# Patient Record
Sex: Female | Born: 1980 | Race: Asian | Hispanic: No | Marital: Married | Smoking: Never smoker
Health system: Southern US, Community
[De-identification: ages and names within clinical notes are randomized; demographics above are authoritative.]

## PROBLEM LIST (undated history)

## (undated) ENCOUNTER — Inpatient Hospital Stay (HOSPITAL_COMMUNITY): Payer: Self-pay

## (undated) DIAGNOSIS — K644 Residual hemorrhoidal skin tags: Secondary | ICD-10-CM

## (undated) DIAGNOSIS — K30 Functional dyspepsia: Secondary | ICD-10-CM

## (undated) DIAGNOSIS — N6452 Nipple discharge: Secondary | ICD-10-CM

## (undated) DIAGNOSIS — R05 Cough: Secondary | ICD-10-CM

## (undated) DIAGNOSIS — E785 Hyperlipidemia, unspecified: Secondary | ICD-10-CM

## (undated) HISTORY — DX: Hyperlipidemia, unspecified: E78.5

---

## 2003-06-30 ENCOUNTER — Inpatient Hospital Stay (HOSPITAL_COMMUNITY): Admission: AD | Admit: 2003-06-30 | Discharge: 2003-06-30 | Payer: Self-pay | Admitting: *Deleted

## 2003-07-02 ENCOUNTER — Inpatient Hospital Stay (HOSPITAL_COMMUNITY): Admission: AD | Admit: 2003-07-02 | Discharge: 2003-07-02 | Payer: Self-pay | Admitting: Obstetrics & Gynecology

## 2003-07-09 ENCOUNTER — Inpatient Hospital Stay (HOSPITAL_COMMUNITY): Admission: AD | Admit: 2003-07-09 | Discharge: 2003-07-09 | Payer: Self-pay | Admitting: Obstetrics & Gynecology

## 2003-07-16 ENCOUNTER — Inpatient Hospital Stay (HOSPITAL_COMMUNITY): Admission: AD | Admit: 2003-07-16 | Discharge: 2003-07-16 | Payer: Self-pay | Admitting: Obstetrics & Gynecology

## 2004-06-08 ENCOUNTER — Observation Stay (HOSPITAL_COMMUNITY): Admission: AD | Admit: 2004-06-08 | Discharge: 2004-06-08 | Payer: Self-pay | Admitting: Obstetrics & Gynecology

## 2004-06-09 ENCOUNTER — Encounter (INDEPENDENT_AMBULATORY_CARE_PROVIDER_SITE_OTHER): Payer: Self-pay | Admitting: *Deleted

## 2004-06-09 ENCOUNTER — Inpatient Hospital Stay (HOSPITAL_COMMUNITY): Admission: AD | Admit: 2004-06-09 | Discharge: 2004-06-11 | Payer: Self-pay | Admitting: Obstetrics & Gynecology

## 2005-02-14 ENCOUNTER — Inpatient Hospital Stay (HOSPITAL_COMMUNITY): Admission: AD | Admit: 2005-02-14 | Discharge: 2005-02-14 | Payer: Self-pay | Admitting: *Deleted

## 2005-02-14 ENCOUNTER — Encounter (INDEPENDENT_AMBULATORY_CARE_PROVIDER_SITE_OTHER): Payer: Self-pay | Admitting: *Deleted

## 2007-03-14 ENCOUNTER — Inpatient Hospital Stay (HOSPITAL_COMMUNITY): Admission: AD | Admit: 2007-03-14 | Discharge: 2007-03-16 | Payer: Self-pay | Admitting: Obstetrics

## 2007-03-14 ENCOUNTER — Inpatient Hospital Stay (HOSPITAL_COMMUNITY): Admission: AD | Admit: 2007-03-14 | Discharge: 2007-03-14 | Payer: Self-pay | Admitting: Obstetrics & Gynecology

## 2009-10-04 ENCOUNTER — Ambulatory Visit: Payer: Self-pay | Admitting: Oncology

## 2011-02-07 LAB — CBC
HCT: 34.6 — ABNORMAL LOW
HCT: 38.5
Hemoglobin: 12
Hemoglobin: 13.4
MCHC: 34.7
MCHC: 34.9
MCV: 92.1
MCV: 92.1
Platelets: 195
Platelets: 198
RBC: 3.76 — ABNORMAL LOW
RBC: 4.18
RDW: 13.4
RDW: 14
WBC: 12.8 — ABNORMAL HIGH
WBC: 13.2 — ABNORMAL HIGH

## 2011-02-07 LAB — RPR: RPR Ser Ql: NONREACTIVE

## 2011-03-17 ENCOUNTER — Other Ambulatory Visit: Payer: Self-pay | Admitting: Family Medicine

## 2011-03-17 ENCOUNTER — Other Ambulatory Visit (HOSPITAL_COMMUNITY)
Admission: RE | Admit: 2011-03-17 | Discharge: 2011-03-17 | Disposition: A | Discharge status: Home / Self Care | Payer: Self-pay | Source: Ambulatory Visit | Attending: Family Medicine | Admitting: Family Medicine

## 2011-03-17 ENCOUNTER — Ambulatory Visit
Admission: RE | Admit: 2011-03-17 | Discharge: 2011-03-17 | Disposition: A | Discharge status: Home / Self Care | Payer: No Typology Code available for payment source | Source: Ambulatory Visit | Attending: Family Medicine | Admitting: Family Medicine

## 2011-03-17 DIAGNOSIS — R599 Enlarged lymph nodes, unspecified: Secondary | ICD-10-CM

## 2011-03-17 DIAGNOSIS — Z124 Encounter for screening for malignant neoplasm of cervix: Secondary | ICD-10-CM | POA: Insufficient documentation

## 2012-01-30 HISTORY — PX: BAND HEMORRHOIDECTOMY: SHX1213

## 2012-03-04 ENCOUNTER — Ambulatory Visit (INDEPENDENT_AMBULATORY_CARE_PROVIDER_SITE_OTHER): Payer: Self-pay | Admitting: Family Medicine

## 2012-03-04 ENCOUNTER — Encounter: Payer: Self-pay | Admitting: Family Medicine

## 2012-03-04 VITALS — BP 105/66 | HR 60 | Temp 98.3°F | Ht 65.0 in | Wt 128.0 lb

## 2012-03-04 DIAGNOSIS — R590 Localized enlarged lymph nodes: Secondary | ICD-10-CM

## 2012-03-04 DIAGNOSIS — R197 Diarrhea, unspecified: Secondary | ICD-10-CM

## 2012-03-04 DIAGNOSIS — R109 Unspecified abdominal pain: Secondary | ICD-10-CM

## 2012-03-04 DIAGNOSIS — R599 Enlarged lymph nodes, unspecified: Secondary | ICD-10-CM

## 2012-03-04 LAB — COMPREHENSIVE METABOLIC PANEL
AST: 16 U/L (ref 0–37)
Albumin: 4.4 g/dL (ref 3.5–5.2)
BUN: 9 mg/dL (ref 6–23)
CO2: 27 mEq/L (ref 19–32)
Calcium: 9 mg/dL (ref 8.4–10.5)
Chloride: 103 mEq/L (ref 96–112)
Creatinine, Ser: 0.4 mg/dL (ref 0.4–1.2)
GFR: 174.15 mL/min (ref >60.00)
Glucose, Bld: 83 mg/dL (ref 70–99)
Potassium: 4.1 mEq/L (ref 3.5–5.1)

## 2012-03-04 LAB — CBC WITH DIFFERENTIAL/PLATELET
Basophils Relative: 0.5 % (ref 0.0–3.0)
Eosinophils Relative: 1.1 % (ref 0.0–5.0)
Hemoglobin: 12.1 g/dL (ref 12.0–15.0)
Lymphocytes Relative: 38.6 % (ref 12.0–46.0)
Monocytes Relative: 7.8 % (ref 3.0–12.0)
Neutro Abs: 2 10*3/uL (ref 1.4–7.7)
Neutrophils Relative %: 52 % (ref 43.0–77.0)
RBC: 3.95 Mil/uL (ref 3.87–5.11)
WBC: 3.9 10*3/uL — ABNORMAL LOW (ref 4.5–10.5)

## 2012-03-04 LAB — H. PYLORI ANTIBODY, IGG: H Pylori IgG: NEGATIVE

## 2012-03-04 NOTE — Progress Notes (Signed)
Office Note 03/04/2012  CC:  Chief Complaint  Patient presents with  . Establish Care    lymph nodes or knot on right side of neck    HPI:  Anna Chen is a 31 y.o. Asian female who is here with interpreter, Connye Burkitt, to establish care. Patient's most recent primary MD: Dr Salli Real, whom she saw only once and was evaluated for her hemorrhoid complaint and referred for treatment/ligation of this. Old records were not reviewed prior to or during today's visit.  Onset 2010 of several swollen areas on left side of neck, painless, saw MD at that time and was told they were nothing to worry about (some tests were done?).  There are four of them, and the largest one has stayed the same size but the other 3 that were smaller have grown over the last 3 yrs.  Reviewed her abd sx's, the history of which is a bit confusing due to language barrier, despite the interpreter's help: diarrhea--onset 3 mo ago: sounds like 2 episodes of diarrheal illness, each lasting 3 d and resonding to her med from [place] called Seirogan.  However, most stools since that initial episode have been poorly formed and she has anywhere from 1-5 of these per day.  Unclear whether these sx's are related to food intake or not, but she seems to describe them as being unconnected to her upper abd pains.  Otherwise she describes food induced gastritis/gerd sx's intermittently.  She has adjusted her PO intake to avoid these sx's now for the most part.  No meds have been tried for this.  No postprandial nausea/vomiting or RUQ pain.  Other: last mammogram 01/30/12 and last Pap smear 01/30/12 per pt report today, no record available today. Denies hx of abnormal pap.  LMP 02/17/12.  Past Medical History  Diagnosis Date  . Hyperlipidemia   . IBS (irritable bowel syndrome)     diarrhea predominant    Past Surgical History  Procedure Date  . Band hemorrhoidectomy 01/2012    Family History  Problem Relation Age of Onset  .  Hypertension Mother   . Hypertension Father     History   Social History  . Marital Status: Single    Spouse Name: N/A    Number of Children: N/A  . Years of Education: N/A   Occupational History  . Not on file.   Social History Main Topics  . Smoking status: Never Smoker   . Smokeless tobacco: Never Used  . Alcohol Use: No  . Drug Use: No  . Sexually Active: Not on file   Other Topics Concern  . 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    Outpatient Encounter Prescriptions as of 03/04/2012  Medication Sig Dispense Refill  . OVER THE COUNTER MEDICATION Stomach/anti-diarrheal med from [place].  Takes as needed.        No Known Allergies  ROS Review of Systems  Constitutional: Negative for fever and fatigue.  HENT: Negative for congestion and sore throat.   Eyes: Negative for visual disturbance.  Respiratory: Negative for cough.   Cardiovascular: Negative for chest pain.  Gastrointestinal: Negative for nausea and abdominal pain.  Genitourinary: Negative for dysuria.  Musculoskeletal: Negative for back pain and joint swelling.  Skin: Negative for rash.  Neurological: Negative for weakness and headaches.  Hematological: Negative for adenopathy.    PE; Blood  pressure 105/66, pulse 60, temperature 98.3 F (36.8 C), temperature source Temporal, height 5\' 5"  (1.651 m), weight 128 lb (58.06 kg), last menstrual period 02/17/2012, SpO2 100.00%. Gen: Alert, well appearing.  Patient is oriented to person, place, time, and situation. ENT: Ears: EACs clear, normal epithelium.  TMs with good light reflex and landmarks bilaterally.  Eyes: no injection, icteris, swelling, or exudate.  EOMI, PERRLA. Nose: no drainage or turbinate edema/swelling.  No injection or focal lesion.  Mouth: lips without lesion/swelling.  Oral mucosa pink and moist.  Dentition  intact and without obvious caries or gingival swelling.  Oropharynx without erythema, exudate, or swelling. She has a few comedonal acne lesions on forehead.  Scalp: no rash or lesions. Neck - No masses or thyromegaly or limitation in range of motion.  Lateral portion of left side of neck: I can feel a few small rubbery, moveable, nontender LN's--all 1cm in size or less.  No induration, no rash, no erythema. CV: RRR, no m/r/g.   LUNGS: CTA bilat, nonlabored resps, good aeration in all lung fields. ABD: soft, NT, ND, BS normal.  No hepatospenomegaly or mass.  No bruits. EXT: no clubbing, cyanosis, or edema.   Pertinent labs:  None today  ASSESSMENT AND PLAN:   New pt: no old records to obtain.  Reactive cervical lymphadenopathy Reassured pt that this looked like a benign, reactive process.  Nothing further needs to be done at this time.  Recurrent abdominal pain Confusing, could be multifactorial but I think the biggest part of this is dyspepsia and it is only occurring intermittently. I recommended a trial of prn zantac. Will also check CBC, CMET, and H. Pylori ab  Diarrhea Unclear etiology.  Question of post-infectious IBS. Will obtain stool studies and a celiac panel.    An After Visit Summary was printed and given to the patient.  She declined a flu vaccine today.  Return in about 1 month (around 04/03/2012) for f/u IBS-like sx's, dyspepsia, etc.

## 2012-03-04 NOTE — Assessment & Plan Note (Signed)
Unclear etiology.  Question of post-infectious IBS. Will obtain stool studies and a celiac panel.

## 2012-03-04 NOTE — Patient Instructions (Signed)
Try over the counter med called Zantac, take as directed--use as needed for stomach pains. Start over the counter fiber supplement like metamucil--as directed on the container.

## 2012-03-04 NOTE — Assessment & Plan Note (Signed)
Reassured pt that this looked like a benign, reactive process.  Nothing further needs to be done at this time.

## 2012-03-04 NOTE — Assessment & Plan Note (Signed)
Confusing, could be multifactorial but I think the biggest part of this is dyspepsia and it is only occurring intermittently. I recommended a trial of prn zantac. Will also check CBC, CMET, and H. Pylori ab

## 2012-03-05 LAB — GLIA (IGA/G) + TTG IGA
Gliadin IgG: 61.3 U/mL — ABNORMAL HIGH (ref <20)
Tissue Transglutaminase Ab, IgA: 7.1 U/mL (ref <20)

## 2012-03-06 LAB — CLOSTRIDIUM DIFFICILE BY PCR: Toxigenic C. Difficile by PCR: NOT DETECTED

## 2012-03-07 ENCOUNTER — Encounter: Payer: Self-pay | Admitting: Family Medicine

## 2012-03-07 ENCOUNTER — Ambulatory Visit (INDEPENDENT_AMBULATORY_CARE_PROVIDER_SITE_OTHER): Payer: Self-pay | Admitting: Family Medicine

## 2012-03-07 VITALS — BP 114/71 | HR 76 | Temp 98.4°F | Ht 65.0 in | Wt 128.0 lb

## 2012-03-07 DIAGNOSIS — K648 Other hemorrhoids: Secondary | ICD-10-CM

## 2012-03-07 MED ORDER — HYDROCORTISONE 2.5 % RE CREA: TOPICAL_CREAM | RECTAL | Status: DC

## 2012-03-07 NOTE — Progress Notes (Signed)
OFFICE NOTE  03/12/2012  CC:  Chief Complaint  Patient presents with  . Hemorrhoids    not bleeding; painful/inflamed; worse after BM     HPI: Patient is a 31 y.o. Asian female who is here with her interpreter, Connye Burkitt, for hemorrhoids. Describes hemorrhoids for 5 yrs, recently feels inflammation/pain again in anal area, some slight BRBPR that was short-lived after a BM.   Brings picture in today showing a large purple protrusion exiting anal opening--this pic was taken shortly after a BM.  She says this then retracts back into rectum.   She has been having sx's suggestive of diarrhea-predominant IBS, no constipation.  Pertinent PMH:  Past Medical History  Diagnosis Date  . Hyperlipidemia   . IBS (irritable bowel syndrome)     diarrhea predominant   Past Surgical History  Procedure Date  . Band hemorrhoidectomy 01/2012    MEDS:  Outpatient Prescriptions Prior to Visit  Medication Sig Dispense Refill  . OVER THE COUNTER MEDICATION Stomach/anti-diarrheal med from [place].  Takes as needed.        PE: Blood pressure 114/71, pulse 76, temperature 98.4 F (36.9 C), temperature source Temporal, height 5\' 5"  (1.651 m), weight 128 lb (58.06 kg), last menstrual period 02/17/2012. Pt examined with interpreter Connye Burkitt (female) in the room today. Gen: Alert, well appearing.  Patient is oriented to person, place, time, and situation. Anal exam: no fissures.  One skin tag about 1-2 cm in size, flesh colored and nontender. A few small, noninflamed external hemorrhoids can be seen but these are not tender to palpation. DRE: a sessile mass is palpable at the 6 oclock position internally--it feels about 2-3 cm in size, oblong shaped.  IMPRESSION AND PLAN:  Internal hemorrhoid, bleeding No active bleeding. Will refer to general surgery for further evaluation and management. For mild external anal irritation and barely visible external hemorrhoids will rx Anusol HC 2.5% today.   An After  Visit Summary was printed and given to the patient.  FOLLOW UP: prn

## 2012-03-09 LAB — STOOL CULTURE

## 2012-03-11 ENCOUNTER — Encounter: Payer: Self-pay | Admitting: *Deleted

## 2012-03-12 DIAGNOSIS — K648 Other hemorrhoids: Secondary | ICD-10-CM | POA: Insufficient documentation

## 2012-03-12 NOTE — Assessment & Plan Note (Signed)
No active bleeding. Will refer to general surgery for further evaluation and management. For mild external anal irritation and barely visible external hemorrhoids will rx Anusol HC 2.5% today.

## 2012-03-19 ENCOUNTER — Encounter (INDEPENDENT_AMBULATORY_CARE_PROVIDER_SITE_OTHER): Payer: Self-pay | Admitting: Surgery

## 2012-03-19 ENCOUNTER — Ambulatory Visit (INDEPENDENT_AMBULATORY_CARE_PROVIDER_SITE_OTHER): Payer: Self-pay | Admitting: Surgery

## 2012-03-19 VITALS — BP 104/70 | HR 89 | Temp 97.5°F | Ht 63.0 in | Wt 129.6 lb

## 2012-03-19 DIAGNOSIS — K645 Perianal venous thrombosis: Secondary | ICD-10-CM

## 2012-03-19 NOTE — Progress Notes (Signed)
Patient ID: Anna Chen, female   DOB: 02-26-1981, 31 y.o.   MRN: [id number]  Chief Complaint  Patient presents with  . Pre-op Exam    eval int hems  . Rectal Problems    HPI Anna Chen is a 31 y.o. female.   HPI She is referred by Dr. Marvel Plan for evaluation of a symptomatic external hemorrhoids. She asked we had a single column banded in [facility] September, but wanted to come see me through her interpreter. She reports continued perianal discomfort and 2 large hemorrhoidal columns that occasionally protrude. She has minimal bleeding. Most of her complaint is pain Past Medical History  Diagnosis Date  . Hyperlipidemia   . IBS (irritable bowel syndrome)     diarrhea predominant    Past Surgical History  Procedure Date  . Band hemorrhoidectomy 01/2012    Family History  Problem Relation Age of Onset  . Hypertension Mother   . Hypertension Father   . Thyroid disease Father     Social History History  Substance Use Topics  . Smoking status: Never Smoker   . Smokeless tobacco: Never Used  . Alcohol Use: No    No Known Allergies  No current outpatient prescriptions on file.    Review of Systems Review of Systems  Constitutional: Negative for fever, chills and unexpected weight change.  HENT: Negative for hearing loss, congestion, sore throat, trouble swallowing and voice change.   Eyes: Negative for visual disturbance.  Respiratory: Negative for cough and wheezing.   Cardiovascular: Negative for chest pain, palpitations and leg swelling.  Gastrointestinal: Positive for diarrhea, anal bleeding and rectal pain. Negative for nausea, vomiting, abdominal pain, constipation, blood in stool and abdominal distention.  Genitourinary: Negative for hematuria, vaginal bleeding and difficulty urinating.  Musculoskeletal: Negative for arthralgias.  Skin: Negative for rash and wound.  Neurological: Negative for seizures, syncope and headaches.  Hematological: Negative for  adenopathy. Does not bruise/bleed easily.  Psychiatric/Behavioral: Negative for confusion.    Blood pressure 104/70, pulse 89, temperature 97.5 F (36.4 C), temperature source Temporal, height 5\' 3"  (1.6 m), weight 129 lb 9.6 oz (58.786 kg), last menstrual period 02/17/2012, SpO2 98.00%.  Physical Exam Physical Exam  Constitutional: She is oriented to person, place, and time. She appears well-developed and well-nourished. No distress.  HENT:  Head: Normocephalic and atraumatic.  Right Ear: External ear normal.  Left Ear: External ear normal.  Nose: Nose normal.  Mouth/Throat: Oropharynx is clear and moist.  Eyes: Conjunctivae normal are normal. Pupils are equal, round, and reactive to light. Right eye exhibits no discharge. Left eye exhibits no discharge. No scleral icterus.  Neck: Normal range of motion. Neck supple. No tracheal deviation present. No thyromegaly present.  Cardiovascular: Normal rate, regular rhythm, normal heart sounds and intact distal pulses.   No murmur heard. Pulmonary/Chest: Effort normal and breath sounds normal. No respiratory distress. She has no wheezes.  Abdominal: Soft. Bowel sounds are normal. She exhibits no distension. There is no tenderness.  Genitourinary:       She has 2 protruding hemorrhoids which are edematous. There are external hemorrhoids. 1 appears thrombosed. I performed an incision and drainage and then reduced the hemorrhoids  Musculoskeletal: Normal range of motion. She exhibits no edema and no tenderness.  Neurological: She is alert and oriented to person, place, and time.  Skin: Skin is warm and dry. No rash noted. She is not diaphoretic. No erythema.  Psychiatric: Her behavior is normal. Judgment normal.    Data Reviewed  Assessment    2 column symptomatic external hemorrhoids    Plan    She is requesting hemorrhoidectomy which I feel is reasonable given her symptoms and the size of the hemorrhoids. Through the interpreter I  explained this in detail. Hemorrhoidectomy will thus be scheduled. I also explained the risks in detail the interpreter. Likelihood of success is good       Melysa Schroyer A 03/19/2012, 10:56 AM

## 2012-03-31 DIAGNOSIS — K644 Residual hemorrhoidal skin tags: Secondary | ICD-10-CM

## 2012-03-31 HISTORY — DX: Residual hemorrhoidal skin tags: K64.4

## 2012-04-02 ENCOUNTER — Ambulatory Visit: Payer: Self-pay | Admitting: Family Medicine

## 2012-04-11 ENCOUNTER — Encounter (HOSPITAL_BASED_OUTPATIENT_CLINIC_OR_DEPARTMENT_OTHER): Payer: Self-pay | Admitting: *Deleted

## 2012-04-11 DIAGNOSIS — R059 Cough, unspecified: Secondary | ICD-10-CM

## 2012-04-11 HISTORY — DX: Cough, unspecified: R05.9

## 2012-04-16 NOTE — H&P (Signed)
Patient ID: Anna Chen, female DOB: [birth year], 31 y.o. MRN: [id number]  Chief Complaint   Patient presents with   .  Pre-op Exam     eval int hems   .  Rectal Problems    HPI  Anna Chen is a 31 y.o. female.  HPI  She is referred by Dr. Marvel Plan for evaluation of a symptomatic external hemorrhoids. She asked we had a single column banded in [facility] September, but wanted to come see me through her interpreter. She reports continued perianal discomfort and 2 large hemorrhoidal columns that occasionally protrude. She has minimal bleeding. Most of her complaint is pain  Past Medical History   Diagnosis  Date   .  Hyperlipidemia    .  IBS (irritable bowel syndrome)      diarrhea predominant    Past Surgical History   Procedure  Date   .  Band hemorrhoidectomy  01/2012    Family History   Problem  Relation  Age of Onset   .  Hypertension  Mother    .  Hypertension  Father    .  Thyroid disease  Father     Social History  History   Substance Use Topics   .  Smoking status:  Never Smoker   .  Smokeless tobacco:  Never Used   .  Alcohol Use:  No    No Known Allergies  No current outpatient prescriptions on file.    Review of Systems  Review of Systems  Constitutional: Negative for fever, chills and unexpected weight change.  HENT: Negative for hearing loss, congestion, sore throat, trouble swallowing and voice change.  Eyes: Negative for visual disturbance.  Respiratory: Negative for cough and wheezing.  Cardiovascular: Negative for chest pain, palpitations and leg swelling.  Gastrointestinal: Positive for diarrhea, anal bleeding and rectal pain. Negative for nausea, vomiting, abdominal pain, constipation, blood in stool and abdominal distention.  Genitourinary: Negative for hematuria, vaginal bleeding and difficulty urinating.  Musculoskeletal: Negative for arthralgias.  Skin: Negative for rash and wound.  Neurological: Negative for seizures, syncope and headaches.   Hematological: Negative for adenopathy. Does not bruise/bleed easily.  Psychiatric/Behavioral: Negative for confusion.   Blood pressure 104/70, pulse 89, temperature 97.5 F (36.4 C), temperature source Temporal, height 5\' 3"  (1.6 m), weight 129 lb 9.6 oz (58.786 kg), last menstrual period 02/17/2012, SpO2 98.00%.  Physical Exam  Physical Exam  Constitutional: She is oriented to person, place, and time. She appears well-developed and well-nourished. No distress.  HENT:  Head: Normocephalic and atraumatic.  Right Ear: External ear normal.  Left Ear: External ear normal.  Nose: Nose normal.  Mouth/Throat: Oropharynx is clear and moist.  Eyes: Conjunctivae normal are normal. Pupils are equal, round, and reactive to light. Right eye exhibits no discharge. Left eye exhibits no discharge. No scleral icterus.  Neck: Normal range of motion. Neck supple. No tracheal deviation present. No thyromegaly present.  Cardiovascular: Normal rate, regular rhythm, normal heart sounds and intact distal pulses.  No murmur heard.  Pulmonary/Chest: Effort normal and breath sounds normal. No respiratory distress. She has no wheezes.  Abdominal: Soft. Bowel sounds are normal. She exhibits no distension. There is no tenderness.  Genitourinary:  She has 2 protruding hemorrhoids which are edematous. There are external hemorrhoids. 1 appears thrombosed. I performed an incision and drainage and then reduced the hemorrhoids  Musculoskeletal: Normal range of motion. She exhibits no edema and no tenderness.  Neurological: She is alert and oriented to person,  place, and time.  Skin: Skin is warm and dry. No rash noted. She is not diaphoretic. No erythema.  Psychiatric: Her behavior is normal. Judgment normal.   Data Reviewed  Assessment   2 column symptomatic external hemorrhoids   Plan   She is requesting hemorrhoidectomy which I feel is reasonable given her symptoms and the size of the hemorrhoids. Through the  interpreter I explained this in detail. Hemorrhoidectomy will thus be scheduled. I also explained the risks in detail the interpreter. Likelihood of success is good   Tajah Schreiner A

## 2012-04-17 ENCOUNTER — Encounter (HOSPITAL_BASED_OUTPATIENT_CLINIC_OR_DEPARTMENT_OTHER): Payer: Self-pay | Admitting: *Deleted

## 2012-04-17 ENCOUNTER — Ambulatory Visit (HOSPITAL_BASED_OUTPATIENT_CLINIC_OR_DEPARTMENT_OTHER)
Admission: RE | Admit: 2012-04-17 | Discharge: 2012-04-17 | Disposition: A | Discharge status: Home / Self Care | Payer: Self-pay | Source: Ambulatory Visit | Attending: Surgery | Admitting: Surgery

## 2012-04-17 ENCOUNTER — Encounter (HOSPITAL_BASED_OUTPATIENT_CLINIC_OR_DEPARTMENT_OTHER): Payer: Self-pay | Admitting: Certified Registered"

## 2012-04-17 ENCOUNTER — Encounter (HOSPITAL_BASED_OUTPATIENT_CLINIC_OR_DEPARTMENT_OTHER)
Admission: RE | Disposition: A | Discharge status: Home / Self Care | Payer: Self-pay | Source: Ambulatory Visit | Attending: Surgery

## 2012-04-17 ENCOUNTER — Ambulatory Visit (HOSPITAL_BASED_OUTPATIENT_CLINIC_OR_DEPARTMENT_OTHER): Payer: Self-pay | Admitting: Certified Registered"

## 2012-04-17 DIAGNOSIS — Z8349 Family history of other endocrine, nutritional and metabolic diseases: Secondary | ICD-10-CM | POA: Insufficient documentation

## 2012-04-17 DIAGNOSIS — K648 Other hemorrhoids: Secondary | ICD-10-CM

## 2012-04-17 DIAGNOSIS — Z8249 Family history of ischemic heart disease and other diseases of the circulatory system: Secondary | ICD-10-CM | POA: Insufficient documentation

## 2012-04-17 DIAGNOSIS — K644 Residual hemorrhoidal skin tags: Secondary | ICD-10-CM | POA: Insufficient documentation

## 2012-04-17 DIAGNOSIS — E785 Hyperlipidemia, unspecified: Secondary | ICD-10-CM | POA: Insufficient documentation

## 2012-04-17 DIAGNOSIS — K589 Irritable bowel syndrome without diarrhea: Secondary | ICD-10-CM | POA: Insufficient documentation

## 2012-04-17 HISTORY — DX: Functional dyspepsia: K30

## 2012-04-17 HISTORY — PX: HEMORRHOID SURGERY: SHX153

## 2012-04-17 HISTORY — DX: Cough: R05

## 2012-04-17 HISTORY — DX: Residual hemorrhoidal skin tags: K64.4

## 2012-04-17 LAB — POCT HEMOGLOBIN-HEMACUE: Hemoglobin: 13.1 g/dL (ref 12.0–15.0)

## 2012-04-17 SURGERY — HEMORRHOIDECTOMY
Anesthesia: General | Site: Rectum | Wound class: Clean

## 2012-04-17 MED ORDER — LIDOCAINE HCL (CARDIAC) 20 MG/ML IV SOLN
INTRAVENOUS | Status: DC | PRN
  Administered 2012-04-17: 80 mg via INTRAVENOUS

## 2012-04-17 MED ORDER — SODIUM CHLORIDE 0.9 % IJ SOLN: 3.0000 mL | Freq: Two times a day (BID) | INTRAMUSCULAR | Status: DC

## 2012-04-17 MED ORDER — OXYCODONE HCL 5 MG PO TABS: 5.0000 mg | ORAL_TABLET | Freq: Once | ORAL | Status: DC | PRN

## 2012-04-17 MED ORDER — BUPIVACAINE LIPOSOME 1.3 % IJ SUSP
INTRAMUSCULAR | Status: DC | PRN
  Administered 2012-04-17: 20 mL

## 2012-04-17 MED ORDER — MIDAZOLAM HCL 5 MG/5ML IJ SOLN
INTRAMUSCULAR | Status: DC | PRN
  Administered 2012-04-17: 2 mg via INTRAVENOUS

## 2012-04-17 MED ORDER — OXYCODONE HCL 5 MG PO TABS: 5.0000 mg | ORAL_TABLET | ORAL | Status: DC | PRN

## 2012-04-17 MED ORDER — ONDANSETRON HCL 4 MG/2ML IJ SOLN: 4.0000 mg | Freq: Four times a day (QID) | INTRAMUSCULAR | Status: DC | PRN

## 2012-04-17 MED ORDER — ONDANSETRON HCL 4 MG/2ML IJ SOLN: 4.0000 mg | Freq: Once | INTRAMUSCULAR | Status: DC | PRN

## 2012-04-17 MED ORDER — SODIUM CHLORIDE 0.9 % IV SOLN: 250.0000 mL | INTRAVENOUS | Status: DC | PRN

## 2012-04-17 MED ORDER — MORPHINE SULFATE 4 MG/ML IJ SOLN: 4.0000 mg | INTRAMUSCULAR | Status: DC | PRN

## 2012-04-17 MED ORDER — SODIUM CHLORIDE 0.9 % IJ SOLN: 3.0000 mL | INTRAMUSCULAR | Status: DC | PRN

## 2012-04-17 MED ORDER — HYDROCODONE-ACETAMINOPHEN 5-325 MG PO TABS: 1.0000 | ORAL_TABLET | ORAL | Status: AC | PRN

## 2012-04-17 MED ORDER — HYDROMORPHONE HCL PF 1 MG/ML IJ SOLN: 0.2500 mg | INTRAMUSCULAR | Status: DC | PRN

## 2012-04-17 MED ORDER — LACTATED RINGERS IV SOLN
INTRAVENOUS | Status: DC
  Administered 2012-04-17: 10:00:00 via INTRAVENOUS

## 2012-04-17 MED ORDER — ONDANSETRON HCL 4 MG/2ML IJ SOLN
INTRAMUSCULAR | Status: DC | PRN
  Administered 2012-04-17: 4 mg via INTRAVENOUS

## 2012-04-17 MED ORDER — CEFOXITIN SODIUM 2 G IV SOLR
2.0000 g | INTRAVENOUS | Status: AC
  Administered 2012-04-17: 2 g via INTRAVENOUS

## 2012-04-17 MED ORDER — PROPOFOL 10 MG/ML IV BOLUS
INTRAVENOUS | Status: DC | PRN
  Administered 2012-04-17: 160 mg via INTRAVENOUS

## 2012-04-17 MED ORDER — OXYCODONE HCL 5 MG/5ML PO SOLN
5.0000 mg | Freq: Once | ORAL | Status: DC | PRN
Start: 2012-04-17 — End: 2012-04-17

## 2012-04-17 MED ORDER — DEXAMETHASONE SODIUM PHOSPHATE 4 MG/ML IJ SOLN
INTRAMUSCULAR | Status: DC | PRN
  Administered 2012-04-17: 8 mg via INTRAVENOUS

## 2012-04-17 MED ORDER — KETOROLAC TROMETHAMINE 30 MG/ML IJ SOLN
INTRAMUSCULAR | Status: DC | PRN
  Administered 2012-04-17: 30 mg via INTRAVENOUS

## 2012-04-17 MED ORDER — OXYCODONE HCL 5 MG/5ML PO SOLN: 5.0000 mg | Freq: Once | ORAL | Status: DC | PRN

## 2012-04-17 MED ORDER — FENTANYL CITRATE 0.05 MG/ML IJ SOLN
INTRAMUSCULAR | Status: DC | PRN
  Administered 2012-04-17: 100 ug via INTRAVENOUS

## 2012-04-17 MED ORDER — ACETAMINOPHEN 325 MG PO TABS: 650.0000 mg | ORAL_TABLET | ORAL | Status: DC | PRN

## 2012-04-17 MED ORDER — LIDOCAINE 5 % EX OINT: TOPICAL_OINTMENT | CUTANEOUS | Status: DC | PRN

## 2012-04-17 MED ORDER — ACETAMINOPHEN 650 MG RE SUPP: 650.0000 mg | RECTAL | Status: DC | PRN

## 2012-04-17 SURGICAL SUPPLY — 35 items
BLADE SURG 15 STRL LF DISP TIS (BLADE) ×1 IMPLANT
BLADE SURG 15 STRL SS (BLADE) ×1
CANISTER SUCTION 1200CC (MISCELLANEOUS) ×2 IMPLANT
CLOTH BEACON ORANGE TIMEOUT ST (SAFETY) ×2 IMPLANT
COVER MAYO STAND STRL (DRAPES) IMPLANT
DECANTER SPIKE VIAL GLASS SM (MISCELLANEOUS) IMPLANT
DRAPE UTILITY XL STRL (DRAPES) ×2 IMPLANT
DRSG PAD ABDOMINAL 8X10 ST (GAUZE/BANDAGES/DRESSINGS) ×2 IMPLANT
ELECT REM PT RETURN 9FT ADLT (ELECTROSURGICAL) ×2
ELECTRODE REM PT RTRN 9FT ADLT (ELECTROSURGICAL) ×1 IMPLANT
GAUZE SPONGE 4X4 12PLY STRL LF (GAUZE/BANDAGES/DRESSINGS) ×2 IMPLANT
GLOVE BIO SURGEON STRL SZ 6.5 (GLOVE) ×4 IMPLANT
GLOVE INDICATOR 7.0 STRL GRN (GLOVE) ×2 IMPLANT
GLOVE SURG SIGNA 7.5 PF LTX (GLOVE) ×2 IMPLANT
GOWN PREVENTION PLUS XLARGE (GOWN DISPOSABLE) ×2 IMPLANT
NEEDLE HYPO 25X1 1.5 SAFETY (NEEDLE) ×2 IMPLANT
PACK BASIN DAY SURGERY FS (CUSTOM PROCEDURE TRAY) ×2 IMPLANT
PACK LITHOTOMY IV (CUSTOM PROCEDURE TRAY) ×2 IMPLANT
PENCIL BUTTON HOLSTER BLD 10FT (ELECTRODE) ×2 IMPLANT
SHEARS HARMONIC 9CM CVD (BLADE) ×2 IMPLANT
SHEET MEDIUM DRAPE 40X70 STRL (DRAPES) ×2 IMPLANT
SPONGE SURGIFOAM ABS GEL 100 (HEMOSTASIS) IMPLANT
SPONGE SURGIFOAM ABS GEL 12-7 (HEMOSTASIS) ×2 IMPLANT
SURGILUBE 2OZ TUBE FLIPTOP (MISCELLANEOUS) ×2 IMPLANT
SUT CHROMIC 2 0 SH (SUTURE) ×2 IMPLANT
SUT CHROMIC 3 0 SH 27 (SUTURE) IMPLANT
SYR CONTROL 10ML LL (SYRINGE) ×2 IMPLANT
TOWEL OR 17X24 6PK STRL BLUE (TOWEL DISPOSABLE) ×4 IMPLANT
TOWEL OR NON WOVEN STRL DISP B (DISPOSABLE) ×2 IMPLANT
TRAY DSU PREP LF (CUSTOM PROCEDURE TRAY) ×2 IMPLANT
TRAY PROCTOSCOPIC FIBER OPTIC (SET/KITS/TRAYS/PACK) IMPLANT
TUBE CONNECTING 20X1/4 (TUBING) ×2 IMPLANT
UNDERPAD 30X30 INCONTINENT (UNDERPADS AND DIAPERS) ×2 IMPLANT
WATER STERILE IRR 1000ML POUR (IV SOLUTION) IMPLANT
YANKAUER SUCT BULB TIP NO VENT (SUCTIONS) ×2 IMPLANT

## 2012-04-17 NOTE — Anesthesia Preprocedure Evaluation (Signed)
Anesthesia Evaluation  Patient identified by MRN, date of birth, ID band Patient awake    Reviewed: Allergy & Precautions, H&P , NPO status , Patient's Chart, lab work & pertinent test results  Airway Mallampati: I TM Distance: >3 FB   Mouth opening: Limited Mouth Opening  Dental  (+) Teeth Intact and Dental Advisory Given   Pulmonary  breath sounds clear to auscultation        Cardiovascular Rhythm:Regular Rate:Normal     Neuro/Psych    GI/Hepatic   Endo/Other    Renal/GU      Musculoskeletal   Abdominal   Peds  Hematology   Anesthesia Other Findings I could not find a hx of blood dyscrasias or PVD from the pt through her interpreter.  Reproductive/Obstetrics                           Anesthesia Physical Anesthesia Plan  ASA: I  Anesthesia Plan: General   Post-op Pain Management:    Induction: Intravenous  Airway Management Planned: LMA  Additional Equipment:   Intra-op Plan:   Post-operative Plan: Extubation in OR  Informed Consent: I have reviewed the patients History and Physical, chart, labs and discussed the procedure including the risks, benefits and alternatives for the proposed anesthesia with the patient or authorized representative who has indicated his/her understanding and acceptance.   Dental advisory given  Plan Discussed with: CRNA, Anesthesiologist and Surgeon  Anesthesia Plan Comments:         Anesthesia Quick Evaluation

## 2012-04-17 NOTE — Anesthesia Postprocedure Evaluation (Signed)
  Anesthesia Post-op Note  Patient: Anna Chen  Procedure(s) Performed: Procedure(s) (LRB) with comments: HEMORRHOIDECTOMY (N/A) - 2 column external /internal hemorrhoidectomy  Patient Location: PACU  Anesthesia Type:General  Level of Consciousness: awake, alert  and oriented  Airway and Oxygen Therapy: Patient Spontanous Breathing  Post-op Pain: none  Post-op Assessment: Post-op Vital signs reviewed  Post-op Vital Signs: Reviewed  Complications: No apparent anesthesia complications

## 2012-04-17 NOTE — Anesthesia Procedure Notes (Signed)
Procedure Name: LMA Insertion Date/Time: 04/17/2012 10:03 AM Performed by: Verlan Friends Pre-anesthesia Checklist: Patient identified, Emergency Drugs available, Suction available, Patient being monitored and Timeout performed Patient Re-evaluated:Patient Re-evaluated prior to inductionOxygen Delivery Method: Circle System Utilized Preoxygenation: Pre-oxygenation with 100% oxygen Intubation Type: IV induction Ventilation: Mask ventilation without difficulty LMA: LMA inserted LMA Size: 4.0 Number of attempts: 1 Airway Equipment and Method: bite block Placement Confirmation: positive ETCO2 Tube secured with: Tape Dental Injury: Teeth and Oropharynx as per pre-operative assessment

## 2012-04-17 NOTE — Transfer of Care (Signed)
Immediate Anesthesia Transfer of Care Note  Patient: Anna Chen  Procedure(s) Performed: Procedure(s) (LRB) with comments: HEMORRHOIDECTOMY (N/A) - 2 column external /internal hemorrhoidectomy  Patient Location: PACU  Anesthesia Type:General  Level of Consciousness: sedated and unresponsive  Airway & Oxygen Therapy: Patient Spontanous Breathing and Patient connected to face mask oxygen  Post-op Assessment: Report given to PACU RN and Post -op Vital signs reviewed and stable  Post vital signs: Reviewed and stable  Complications: No apparent anesthesia complications

## 2012-04-17 NOTE — Op Note (Signed)
HEMORRHOIDECTOMY  Procedure Note  Anna Chen 04/17/2012   Pre-op Diagnosis: symptomic external hemorrhoids     Post-op Diagnosis: bleeding internal and external hemorrhoids  Procedure(s): 2 COLUMN INTERNAL AND EXTERNAL HEMORRHOIDECTOMY  Surgeon(s): Shelly Rubenstein, MD  Anesthesia: General  Staff:  Drue Dun, CST - Scrub Person Salley Scarlet, RN - Circulator  Estimated Blood Loss: Minimal               Specimens: hemorrhoids  Procedure: The patient was brought to the operating room identified as the correct patient. She was placed supine on the operating room table and general anesthesia was induced. The patient was placed in the lithotomy position. Her perianal area was then prepped and draped in the usual sterile fashion. I injected bupivacaine circumferentially around the perianal area. The patient had Chen large internal and external hemorrhoidal column in directly anteriorly. I excises the harmonic scalpel. It was sent to pathology for evaluation. There was Chen smaller column just to right of the posterior midline. I excises the harmonic scalpel as well. No other intra-anal pathology was identified. I injected further bupivacaine around the perianal area. I inserted Chen small piece of Gelfoam into the anal canal. The patient tolerated the procedure well. All the counts were correct at the end of the procedure. The patient was then extubated in the operating room and taken extant condition to the recovery room.          Anna Chen   Date: 04/17/2012  Time: 10:23 AM

## 2012-04-17 NOTE — Interval H&P Note (Signed)
History and Physical Interval Note: no change in H and P  04/17/2012 9:10 AM  Anna Chen  has presented today for surgery, with the diagnosis of symptomic external hemorrhoids  The various methods of treatment have been discussed with the patient and family. After consideration of risks, benefits and other options for treatment, the patient has consented to  Procedure(s) (LRB) with comments: HEMORRHOIDECTOMY (N/A) - 2 column external /internal hemorrhoidectomy as a surgical intervention .  The patient's history has been reviewed, patient examined, no change in status, stable for surgery.  I have reviewed the patient's chart and labs.  Questions were answered to the patient's satisfaction.     [name]

## 2012-04-18 ENCOUNTER — Encounter (HOSPITAL_BASED_OUTPATIENT_CLINIC_OR_DEPARTMENT_OTHER): Payer: Self-pay | Admitting: Surgery

## 2012-04-28 ENCOUNTER — Telehealth (INDEPENDENT_AMBULATORY_CARE_PROVIDER_SITE_OTHER): Payer: Self-pay | Admitting: General Surgery

## 2012-04-28 NOTE — Telephone Encounter (Signed)
She called saying that she was having some discharge from the area of her hemorrhoidectomy with some bleeding.  Her friend talked for her because of her lack of english.  I recommended that she go to the ER for evaluation because I could not diagnose her condition over the phone.  If she does not go to the ER then she should call our office to be seen tomorrow.

## 2012-04-28 NOTE — Telephone Encounter (Signed)
She called saying that she was having some discharge from the area of her hemorrhoidectomy with some bleeding.  Her friend talked for her because of her lack of english.  I recommended that she go to the ER for evaluation because I could not diagnose her condition over the phone.  If she does not go to the ER then she should call our office to be seen tomorrow. 

## 2012-04-29 ENCOUNTER — Telehealth (INDEPENDENT_AMBULATORY_CARE_PROVIDER_SITE_OTHER): Payer: Self-pay | Admitting: General Surgery

## 2012-04-29 NOTE — Telephone Encounter (Signed)
Patient's interpreter called stating that patient was having mucus and blood mix with odor when wiping rectal area with toilet paper and not wet wipes, patient was not running any fever...she is s/p hemorrhoidectomy on 04/17/12 by Dr.Blackman and has follow up appt on 05/07/12 with Dr.Blackman, patient was not with interpreter so i gave the interpreter d/c instructions that the patient needs to be following and is suppose to call us back should she have any other questions or concerns

## 2012-05-07 ENCOUNTER — Ambulatory Visit (INDEPENDENT_AMBULATORY_CARE_PROVIDER_SITE_OTHER): Payer: Self-pay | Admitting: Surgery

## 2012-05-07 ENCOUNTER — Encounter (INDEPENDENT_AMBULATORY_CARE_PROVIDER_SITE_OTHER): Payer: Self-pay | Admitting: Surgery

## 2012-05-07 VITALS — BP 110/66 | HR 72 | Temp 97.1°F | Resp 12 | Ht 63.0 in | Wt 129.6 lb

## 2012-05-07 DIAGNOSIS — Z09 Encounter for follow-up examination after completed treatment for conditions other than malignant neoplasm: Secondary | ICD-10-CM

## 2012-05-07 NOTE — Progress Notes (Signed)
Subjective:     Patient ID: Anna Chen, female   DOB: May 08, 1980, 32 y.o.   MRN: [id number]  HPI She is here for her first postop visit. She is doing well and has no complaints. She has minimal drainage  Review of Systems     Objective:   Physical Exam On exam, the hemorrhoidectomy sites are healing well. The final pathology showed benign hemorrhoidal tissue    Assessment:     Patient stable postop    Plan:     I will see her as needed. If she is still having drainage a month she will come back and see me

## 2012-10-30 ENCOUNTER — Ambulatory Visit (INDEPENDENT_AMBULATORY_CARE_PROVIDER_SITE_OTHER)
Admission: RE | Admit: 2012-10-30 | Discharge: 2012-10-30 | Disposition: A | Discharge status: Home / Self Care | Payer: Self-pay | Source: Ambulatory Visit | Attending: Family Medicine | Admitting: Family Medicine

## 2012-10-30 ENCOUNTER — Ambulatory Visit (INDEPENDENT_AMBULATORY_CARE_PROVIDER_SITE_OTHER): Payer: Self-pay | Admitting: Family Medicine

## 2012-10-30 ENCOUNTER — Encounter: Payer: Self-pay | Admitting: Family Medicine

## 2012-10-30 ENCOUNTER — Telehealth: Payer: Self-pay | Admitting: Family Medicine

## 2012-10-30 VITALS — BP 104/64 | HR 62 | Temp 97.6°F | Resp 16 | Ht 65.0 in | Wt 131.0 lb

## 2012-10-30 DIAGNOSIS — K5900 Constipation, unspecified: Secondary | ICD-10-CM

## 2012-10-30 MED ORDER — PANTOPRAZOLE SODIUM 40 MG PO TBEC: 40.0000 mg | DELAYED_RELEASE_TABLET | Freq: Every day | ORAL | Status: DC

## 2012-10-30 NOTE — Telephone Encounter (Signed)
Pls notify pt that her x-ray of her bowels did show that she has lots of retained stool and likely would benefit from a DAILY medication to help with constipation. I recommend she buy an OTC med call senakot-S (definitely buy the generic/store brand) and take 2 tabs every night until I see her again.  Take the other med I prescribed today as well (pantoprazole)--no change in the plans as far as this med goes.-thx

## 2012-10-30 NOTE — Patient Instructions (Signed)
Gastroesophageal Reflux Disease, Adult  Gastroesophageal reflux disease (GERD) happens when acid from your stomach flows up into the esophagus. When acid comes in contact with the esophagus, the acid causes soreness (inflammation) in the esophagus. Over time, GERD may create small holes (ulcers) in the lining of the esophagus.  CAUSES   · Increased body weight. This puts pressure on the stomach, making acid rise from the stomach into the esophagus.  · Smoking. This increases acid production in the stomach.  · Drinking alcohol. This causes decreased pressure in the lower esophageal sphincter (valve or ring of muscle between the esophagus and stomach), allowing acid from the stomach into the esophagus.  · Late evening meals and a full stomach. This increases pressure and acid production in the stomach.  · A malformed lower esophageal sphincter.  Sometimes, no cause is found.  SYMPTOMS   · Burning pain in the lower part of the mid-chest behind the breastbone and in the mid-stomach area. This may occur twice a week or more often.  · Trouble swallowing.  · Sore throat.  · Dry cough.  · Asthma-like symptoms including chest tightness, shortness of breath, or wheezing.  DIAGNOSIS   Your caregiver may be able to diagnose GERD based on your symptoms. In some cases, X-rays and other tests may be done to check for complications or to check the condition of your stomach and esophagus.  TREATMENT   Your caregiver may recommend over-the-counter or prescription medicines to help decrease acid production. Ask your caregiver before starting or adding any new medicines.   HOME CARE INSTRUCTIONS   · Change the factors that you can control. Ask your caregiver for guidance concerning weight loss, quitting smoking, and alcohol consumption.  · Avoid foods and drinks that make your symptoms worse, such as:  · Caffeine or alcoholic drinks.  · Chocolate.  · Peppermint or mint flavorings.  · Garlic and onions.  · Spicy foods.  · Citrus fruits,  such as oranges, lemons, or limes.  · Tomato-based foods such as sauce, chili, salsa, and pizza.  · Fried and fatty foods.  · Avoid lying down for the 3 hours prior to your bedtime or prior to taking a nap.  · Eat small, frequent meals instead of large meals.  · Wear loose-fitting clothing. Do not wear anything tight around your waist that causes pressure on your stomach.  · Raise the head of your bed 6 to 8 inches with wood blocks to help you sleep. Extra pillows will not help.  · Only take over-the-counter or prescription medicines for pain, discomfort, or fever as directed by your caregiver.  · Do not take aspirin, ibuprofen, or other nonsteroidal anti-inflammatory drugs (NSAIDs).  SEEK IMMEDIATE MEDICAL CARE IF:   · You have pain in your arms, neck, jaw, teeth, or back.  · Your pain increases or changes in intensity or duration.  · You develop nausea, vomiting, or sweating (diaphoresis).  · You develop shortness of breath, or you faint.  · Your vomit is green, yellow, black, or looks like coffee grounds or blood.  · Your stool is red, bloody, or black.  These symptoms could be signs of other problems, such as heart disease, gastric bleeding, or esophageal bleeding.  MAKE SURE YOU:   · Understand these instructions.  · Will watch your condition.  · Will get help right away if you are not doing well or get worse.  Document Released: 01/25/2005 Document Revised: 07/10/2011 Document Reviewed: 11/04/2010  Exit[employer]® Patient   Information ©2014 [employer].

## 2012-10-30 NOTE — Telephone Encounter (Signed)
Ally aware and will pass information on to patient.

## 2012-10-30 NOTE — Progress Notes (Addendum)
OFFICE NOTE  10/30/2012  CC:  Chief Complaint  Patient presents with  . Rectal Bleeding     HPI: Patient is a 32 y.o. Asian female who is here for stool complaints. Says stool not normal "shape", comes out in pieces and has some undigested vegetable matter, common to feel incomplete evacuation.  One month ago a couple of days of diarrhea.   No blood in stool.  No pus in stool.  After eating she burps a lot.  Has some GER after spicy foods and sweets.  Bloating sx's are fairly common for her.   She was rx'd zantac in the past by myself and this helped but recent use of this again recently was unsuccessful. Points to mid abd just above umbillicus as the common area she feels pain. Has tried nexium for 1 wk and it helped but it caused submandibular discomfort and odd sensation in ears.   She tried samples of linzess x 2 d and it caused diarrhea so she stopped it.  LMP was 10/15/12 Menses are fairly regular.  Pertinent PMH:  Past Medical History  Diagnosis Date  . Hyperlipidemia     no current med.  . Indigestion     takes OTC as needed  . Hemorrhoids, external 03/2012    symptomatic  . Cough 04/11/2012   Past Surgical History  Procedure Laterality Date  . Band hemorrhoidectomy  01/2012  . Hemorrhoid surgery  04/17/2012    Procedure: HEMORRHOIDECTOMY;  Surgeon: Shelly Rubenstein, MD;  Location: [facility];  Service: General;  Laterality: N/A;  2 column external /internal hemorrhoidectomy    MEDS:  Outpatient Prescriptions Prior to Visit  Medication Sig Dispense Refill  . lidocaine (XYLOCAINE) 5 % ointment Apply topically as needed.  35.44 g  0   No facility-administered medications prior to visit.  Probiotic (in an [place] package)  PE: Blood pressure 104/64, pulse 62, temperature 97.6 F (36.4 C), temperature source Oral, resp. rate 16, height 5\' 5"  (1.651 m), weight 131 lb (59.421 kg), SpO2 99.00%. ENT: no icterus Gen: Alert, well appearing.  Patient is  oriented to person, place, time, and situation. CV: RRR, no m/r/g.   LUNGS: CTA bilat, nonlabored resps, good aeration in all lung fields. ABD: soft, NT, ND, BS normal.  No hepatospenomegaly or mass.  No bruits. EXT: no clubbing, cyanosis, or edema.    IMPRESSION AND PLAN:  Dyspepsia and GERD + possible constip predominant IBS. Will start trial of pantoprazole 40mg  qd and obtain KUB today to check stool burden. GERD diet handout given.  FOLLOW UP: 1 mo    ADDENDUM: KUB per my reading does show lots of stool burden in entire colon, without distention. Will recommend pt take 2 generic senakot S nightly and will f/u radiologist's over-read of the KUB today.

## 2012-12-03 ENCOUNTER — Ambulatory Visit (INDEPENDENT_AMBULATORY_CARE_PROVIDER_SITE_OTHER): Payer: Self-pay | Admitting: Family Medicine

## 2012-12-03 ENCOUNTER — Encounter: Payer: Self-pay | Admitting: Family Medicine

## 2012-12-03 VITALS — BP 116/72 | HR 65 | Temp 98.2°F | Resp 16 | Ht 65.0 in | Wt 132.0 lb

## 2012-12-03 DIAGNOSIS — R1013 Epigastric pain: Secondary | ICD-10-CM

## 2012-12-03 DIAGNOSIS — K589 Irritable bowel syndrome without diarrhea: Secondary | ICD-10-CM

## 2012-12-03 MED ORDER — PANTOPRAZOLE SODIUM 40 MG PO TBEC: 40.0000 mg | DELAYED_RELEASE_TABLET | Freq: Every day | ORAL | Status: DC

## 2012-12-03 NOTE — Patient Instructions (Signed)
Buy generic for metamucil and take 1 full dose as directed on container every night. High fiber diet.

## 2012-12-03 NOTE — Assessment & Plan Note (Signed)
I think she has a mild case of this. I will have her continue senakot (without colace added) and I'll have her add metamucil nightly. High fiber diet handout given.

## 2012-12-03 NOTE — Assessment & Plan Note (Signed)
Much improved. Rx for pantoprazole 40mg  qd to fill if sx's recur and stay for more than 1-2 days.

## 2012-12-03 NOTE — Progress Notes (Signed)
OFFICE NOTE  12/03/2012  CC:  Chief Complaint  Patient presents with  . Follow-up     HPI: Patient is a 32 y.o. Asian female who is here for 1 mo f/u constipation and GERD. Started pantoprazole 40mg  qd and senakot without colace 2 tabs qhs last visit.   She has been compliant with these meds and feels better.  Has been off of her pantoprazole for 1 week and her epigastric discomfort and has had no return of symptoms.    Still having intermittent periods of 1-2 looser type BMs per day, some feeling of incomplete emptying.  No postprandial urgent diarrhea.  No lower abd cramping. These sx's have been present for about 1 yr.  No one in home with similar sx's.  Seems to have some normal days and some not normal.  She notes that when she eats more high fiber foods she has a better formed BM.  When eats more fruits and veggies she has a looser BM, less satisfactory colon emptying.   Pertinent PMH:  Past Medical History  Diagnosis Date  . Hyperlipidemia     no current med.  . Indigestion     takes OTC as needed  . Hemorrhoids, external 03/2012    symptomatic  . Cough 04/11/2012   Past Surgical History  Procedure Laterality Date  . Band hemorrhoidectomy  01/2012  . Hemorrhoid surgery  04/17/2012    Procedure: HEMORRHOIDECTOMY;  Surgeon: Shelly Rubenstein, MD;  Location: [facility];  Service: General;  Laterality: N/A;  2 column external /internal hemorrhoidectomy    MEDS:  Outpatient Prescriptions Prior to Visit  Medication Sig Dispense Refill  . pantoprazole (PROTONIX) 40 MG tablet Take 1 tablet (40 mg total) by mouth daily.  30 tablet  3  . lidocaine (XYLOCAINE) 5 % ointment Apply topically as needed.  35.44 g  0   No facility-administered medications prior to visit.  Senakot nightly  PE: Blood pressure 116/72, pulse 65, temperature 98.2 F (36.8 C), temperature source Temporal, resp. rate 16, height 5\' 5"  (1.651 m), weight 132 lb (59.875 kg), last menstrual  period 11/21/2012, SpO2 98.00%. Gen: Alert, well appearing.  Patient is oriented to person, place, time, and situation. ABD: soft, NT, ND, BS normal.  No hepatospenomegaly or mass.  No bruits.  IMPRESSION AND PLAN:  Dyspepsia Much improved. Rx for pantoprazole 40mg  qd to fill if sx's recur and stay for more than 1-2 days.   IBS (irritable bowel syndrome) I think she has a mild case of this. I will have her continue senakot (without colace added) and I'll have her add metamucil nightly. High fiber diet handout given.   An After Visit Summary was printed and given to the patient.  FOLLOW UP: 49mo

## 2013-02-28 ENCOUNTER — Ambulatory Visit
Admission: RE | Admit: 2013-02-28 | Discharge: 2013-02-28 | Disposition: A | Discharge status: Home / Self Care | Payer: No Typology Code available for payment source | Source: Ambulatory Visit | Attending: Chiropractic Medicine | Admitting: Chiropractic Medicine

## 2013-02-28 ENCOUNTER — Other Ambulatory Visit: Payer: Self-pay | Admitting: Chiropractic Medicine

## 2013-02-28 DIAGNOSIS — M5412 Radiculopathy, cervical region: Secondary | ICD-10-CM

## 2013-02-28 DIAGNOSIS — M542 Cervicalgia: Secondary | ICD-10-CM

## 2013-02-28 DIAGNOSIS — M5416 Radiculopathy, lumbar region: Secondary | ICD-10-CM

## 2013-02-28 DIAGNOSIS — M545 Low back pain: Secondary | ICD-10-CM

## 2013-08-14 ENCOUNTER — Encounter: Payer: Self-pay | Admitting: Family Medicine

## 2013-08-14 ENCOUNTER — Ambulatory Visit (INDEPENDENT_AMBULATORY_CARE_PROVIDER_SITE_OTHER): Payer: Self-pay | Admitting: Family Medicine

## 2013-08-14 VITALS — BP 108/69 | HR 66 | Temp 97.9°F | Resp 18 | Ht 65.0 in | Wt 136.0 lb

## 2013-08-14 DIAGNOSIS — R590 Localized enlarged lymph nodes: Secondary | ICD-10-CM

## 2013-08-14 DIAGNOSIS — R599 Enlarged lymph nodes, unspecified: Secondary | ICD-10-CM

## 2013-08-14 DIAGNOSIS — K219 Gastro-esophageal reflux disease without esophagitis: Secondary | ICD-10-CM

## 2013-08-14 DIAGNOSIS — K589 Irritable bowel syndrome without diarrhea: Secondary | ICD-10-CM

## 2013-08-14 DIAGNOSIS — J310 Chronic rhinitis: Secondary | ICD-10-CM

## 2013-08-14 NOTE — Progress Notes (Signed)
Pre visit review using our clinic review tool, if applicable. No additional management support is needed unless otherwise documented below in the visit note. 

## 2013-08-14 NOTE — Progress Notes (Signed)
OFFICE NOTE  08/14/2013  CC:  Chief Complaint  Patient presents with  . Follow-up    GERD, IBS     HPI: Patient is a 33 y.o. Mandarin [place] female who is here with her interpreter, Radonna Ricker, for f/u. I have not seen her in about 8 mo.   Last 2 mo has had nasal irritation and occ blood tinged crusty mucous.  Not much sneezing, no cough.  No itchy/runny eyes, no itchy throat, no stuffy head feeling.  Says she has left sided neck swelling/lump for last 5 yrs.  Was examined by an MD for this 5 yrs ago and was told to "monitor it" and she reports nothing has changed until recently she has felt like one has gotten a bit bigger.  No signif pain in the region.  Denies hx of any imaging being done in this area.  She says everything from a GI standpoint is going well.   Pertinent PMH:  Past medical, surgical, social hx reviewed. Mainly GERD, constipation, IBS. She reports that she got a pap smear in [place] this year already and it was normal. Also, she is still lactating (last child b. 6 years ago) and some blood tests were done in [place] at [facility] and she doesn't recall results and was told "it could be your hormones".  She cannot recall the name of MD or office so we cannot get records.  MEDS: Not taking pantoprazole anymore. Has several vitamin/dietary supplements with her, including a chinese pancreatic enzyme pill.    PE: Blood pressure 108/69, pulse 66, temperature 97.9 F (36.6 C), temperature source Oral, resp. rate 18, height 5\' 5"  (1.651 m), weight 136 lb (61.689 kg), last menstrual period 08/06/2013, SpO2 100.00%. Gen: Alert, well appearing.  Patient is oriented to person, place, time, and situation. ENT: Ears: EACs clear, normal epithelium.  TMs with good light reflex and landmarks bilaterally.  Eyes: no injection, icteris, swelling, or exudate.  EOMI, PERRLA. Nose: no drainage or turbinate edema/swelling.  No injection or focal lesion.  Mouth: lips without lesion/swelling.  Oral mucosa  pink and moist.  One or two pinpoint areas of irritation.  Mildly edematous turbinates.  No purulent mucous.  Dentition intact and without obvious caries or gingival swelling.  Oropharynx without erythema, exudate, or swelling.  NECK: no LAD, mass, thyromegaly, or tenderness. CV: RRR, no m/r/g.   LUNGS: CTA bilat, nonlabored resps, good aeration in all lung fields.   IMPRESSION AND PLAN:  1) Nasal cong/irritation: pretty normal.  Reassured.  Use OTC saline nasal spray regularly. Avoid aeroallergens if possible.  2) GERD/constipation/IBS: all doing well currently OFF of rx meds. May continue taking the chinese supplements she is on (vitamins + one pancreatic enzyme supplement).  An After Visit Summary was printed and given to the patient.  FOLLOW UP: prn

## 2013-08-14 NOTE — Patient Instructions (Signed)
Buy over the counter, generic saline nasal spray and use 2-3 sprays in each nostril 1-2 times per day to help keep nose from getting irritated.

## 2013-11-13 ENCOUNTER — Ambulatory Visit (INDEPENDENT_AMBULATORY_CARE_PROVIDER_SITE_OTHER): Payer: Self-pay | Admitting: Family Medicine

## 2013-11-13 ENCOUNTER — Encounter: Payer: Self-pay | Admitting: Family Medicine

## 2013-11-13 VITALS — BP 100/65 | HR 60 | Temp 99.2°F | Resp 16 | Ht 65.0 in | Wt 132.0 lb

## 2013-11-13 DIAGNOSIS — A499 Bacterial infection, unspecified: Secondary | ICD-10-CM

## 2013-11-13 DIAGNOSIS — B373 Candidiasis of vulva and vagina: Secondary | ICD-10-CM

## 2013-11-13 DIAGNOSIS — N760 Acute vaginitis: Secondary | ICD-10-CM

## 2013-11-13 DIAGNOSIS — B9689 Other specified bacterial agents as the cause of diseases classified elsewhere: Secondary | ICD-10-CM

## 2013-11-13 DIAGNOSIS — B3731 Acute candidiasis of vulva and vagina: Secondary | ICD-10-CM

## 2013-11-13 DIAGNOSIS — N898 Other specified noninflammatory disorders of vagina: Secondary | ICD-10-CM

## 2013-11-13 LAB — POCT WET PREP (WET MOUNT)
Clue Cells Wet Prep Whiff POC: POSITIVE
KOH WET PREP POC: POSITIVE
TRICHOMONAS WET PREP HPF POC: NEGATIVE

## 2013-11-13 MED ORDER — METRONIDAZOLE 500 MG PO TABS: ORAL_TABLET | ORAL | Status: DC

## 2013-11-13 MED ORDER — FLUCONAZOLE 150 MG PO TABS: ORAL_TABLET | ORAL | Status: DC

## 2013-11-13 NOTE — Progress Notes (Signed)
OFFICE NOTE  11/13/2013  CC:  Chief Complaint  Patient presents with  . Vaginal Itching    outside itching   HPI: Patient is a 33 y.o. Asian female who is here with interpreter, Radonna Ricker, for vaginal itching.   Half a year of vag itching-"inside the labia but not deep".  No itching on outside of vagina.  Also has had 1-2 yrs of lots of white d/c.  Seems to be getting worse. Married, monogamous.  No hx of STD. LMP 10/22/13--approx 27 d cycles.  No vag spotting in between menses. No contraception method is used.  Pap is UTD.  Pertinent PMH:  Past surgical, social, and family history reviewed and no changes noted since last office visit.  MEDS:  NONE  PE: Blood pressure 100/65, pulse 60, temperature 99.2 F (37.3 C), temperature source Temporal, resp. rate 16, height 5\' 5"  (1.651 m), weight 132 lb (59.875 kg), last menstrual period 10/22/2013, SpO2 99.00%. Gen: Alert, well appearing.  Patient is oriented to person, place, time, and situation. Vagina and vulva are normal;  A fair amount of white discharge is noted.  Cervix normal without lesions.  GC/chlamydia cervical swab done, wet prep specimen obtained.   Exam chaperoned by female assistant-CMA Jacklynn Ganong.  Wet prep: many clue cells, many wBCs, many hyphae and budding yeast.  No trich.  IMPRESSION AND PLAN:  Bacterial vaginosis and yeast vaginitis. Flagyl 500mg  bid x 7d. Diflucan 150mg  qd x 2d.  An After Visit Summary was printed and given to the patient.  FOLLOW UP: prn

## 2013-11-13 NOTE — Progress Notes (Signed)
Pre visit review using our clinic review tool, if applicable. No additional management support is needed unless otherwise documented below in the visit note. 

## 2013-11-15 LAB — GC/CHLAMYDIA PROBE AMP
CT PROBE, AMP APTIMA: NEGATIVE
GC PROBE AMP APTIMA: NEGATIVE

## 2014-07-18 ENCOUNTER — Ambulatory Visit (INDEPENDENT_AMBULATORY_CARE_PROVIDER_SITE_OTHER): Payer: Self-pay | Admitting: Family Medicine

## 2014-07-18 VITALS — BP 100/60 | HR 62 | Temp 97.6°F | Ht 65.0 in | Wt 137.2 lb

## 2014-07-18 DIAGNOSIS — K644 Residual hemorrhoidal skin tags: Secondary | ICD-10-CM

## 2014-07-18 DIAGNOSIS — K5900 Constipation, unspecified: Secondary | ICD-10-CM

## 2014-07-18 DIAGNOSIS — Z0000 Encounter for general adult medical examination without abnormal findings: Secondary | ICD-10-CM

## 2014-07-18 DIAGNOSIS — K648 Other hemorrhoids: Secondary | ICD-10-CM

## 2014-07-18 DIAGNOSIS — L700 Acne vulgaris: Secondary | ICD-10-CM

## 2014-07-18 DIAGNOSIS — IMO0002 Reserved for concepts with insufficient information to code with codable children: Secondary | ICD-10-CM

## 2014-07-18 DIAGNOSIS — N898 Other specified noninflammatory disorders of vagina: Secondary | ICD-10-CM

## 2014-07-18 DIAGNOSIS — R896 Abnormal cytological findings in specimens from other organs, systems and tissues: Secondary | ICD-10-CM

## 2014-07-18 LAB — COMPREHENSIVE METABOLIC PANEL
ALT: 15 U/L (ref 0–35)
AST: 14 U/L (ref 0–37)
Albumin: 4.2 g/dL (ref 3.5–5.2)
Alkaline Phosphatase: 37 U/L — ABNORMAL LOW (ref 39–117)
BUN: 11 mg/dL (ref 6–23)
CO2: 25 meq/L (ref 19–32)
CREATININE: 0.5 mg/dL (ref 0.50–1.10)
Calcium: 9.1 mg/dL (ref 8.4–10.5)
Chloride: 105 mEq/L (ref 96–112)
Glucose, Bld: 86 mg/dL (ref 70–99)
Potassium: 4.5 mEq/L (ref 3.5–5.3)
SODIUM: 136 meq/L (ref 135–145)
TOTAL PROTEIN: 7.1 g/dL (ref 6.0–8.3)
Total Bilirubin: 0.4 mg/dL (ref 0.2–1.2)

## 2014-07-18 LAB — POCT URINALYSIS DIPSTICK
BILIRUBIN UA: NEGATIVE
Glucose, UA: NEGATIVE
KETONES UA: NEGATIVE
Leukocytes, UA: NEGATIVE
NITRITE UA: NEGATIVE
PROTEIN UA: NEGATIVE
RBC UA: NEGATIVE
Spec Grav, UA: 1.015
Urobilinogen, UA: 0.2
pH, UA: 7

## 2014-07-18 LAB — POCT WET PREP WITH KOH
CLUE CELLS WET PREP PER HPF POC: NEGATIVE
KOH Prep POC: NEGATIVE
Trichomonas, UA: NEGATIVE
YEAST WET PREP PER HPF POC: NEGATIVE

## 2014-07-18 LAB — POCT UA - MICROSCOPIC ONLY
Casts, Ur, LPF, POC: NEGATIVE
Crystals, Ur, HPF, POC: NEGATIVE
Mucus, UA: NEGATIVE
Yeast, UA: NEGATIVE

## 2014-07-18 LAB — POCT SEDIMENTATION RATE: POCT SED RATE: 16 mm/hr (ref 0–22)

## 2014-07-18 LAB — POCT CBC
GRANULOCYTE PERCENT: 57.6 % (ref 37–80)
HCT, POC: 37.5 % — AB (ref 37.7–47.9)
Hemoglobin: 12 g/dL — AB (ref 12.2–16.2)
LYMPH, POC: 1.9 (ref 0.6–3.4)
MCH, POC: 29.3 pg (ref 27–31.2)
MCHC: 32.1 g/dL (ref 31.8–35.4)
MCV: 91.2 fL (ref 80–97)
MID (CBC): 0.3 (ref 0–0.9)
MPV: 6.6 fL (ref 0–99.8)
PLATELET COUNT, POC: 253 10*3/uL (ref 142–424)
POC GRANULOCYTE: 2.9 (ref 2–6.9)
POC LYMPH %: 36.3 % (ref 10–50)
POC MID %: 6.1 % (ref 0–12)
RBC: 4.11 M/uL (ref 4.04–5.48)
RDW, POC: 12.9 %
WBC: 5.1 10*3/uL (ref 4.6–10.2)

## 2014-07-18 LAB — LIPID PANEL
CHOL/HDL RATIO: 2.4 ratio
Cholesterol: 175 mg/dL (ref 0–200)
HDL: 74 mg/dL (ref >=46)
LDL CALC: 85 mg/dL (ref 0–99)
Triglycerides: 78 mg/dL (ref <150)
VLDL: 16 mg/dL (ref 0–40)

## 2014-07-18 LAB — IFOBT (OCCULT BLOOD): IFOBT: NEGATIVE

## 2014-07-18 MED ORDER — SENNOSIDES-DOCUSATE SODIUM 8.6-50 MG PO TABS: 1.0000 | ORAL_TABLET | Freq: Every day | ORAL | Status: DC

## 2014-07-18 MED ORDER — POLYETHYLENE GLYCOL 3350 17 GM/SCOOP PO POWD: 17.0000 g | Freq: Every day | ORAL | Status: DC

## 2014-07-18 NOTE — Progress Notes (Addendum)
Subjective:    Patient ID: Anna Chen, female    DOB: 19-Aug-1980, 34 y.o.   MRN: [id number] This chart was scribed for Delman Cheadle, MD by Marti Sleigh, Medical Scribe. This patient was seen in Room 4 and the patient's care was started a 10:04 AM.  Chief Complaint  Patient presents with  . Annual Exam    Fasting-- requesting Pap    HPI  Past Medical History  Diagnosis Date  . Hyperlipidemia     no current med.  . Indigestion     takes OTC as needed  . Hemorrhoids, external 03/2012    symptomatic  . Cough 04/11/2012   No Known Allergies No current outpatient prescriptions on file prior to visit.   No current facility-administered medications on file prior to visit.    HPI Comments: Pt does not speak english well. Pt's niece translated. Anna Chen is a 34 y.o. female with a past hx of internal hemorrhoids, IBS, and abdominal pain who presents to [facility] for a full physical. Pt states her legs have been sore, which is abnormal, but she thinks this may be due to increased working. Pt states she would like a breast exam and a pap smear. Pt denies past hx of abnormal pap smear. Pt states she has been constipated for the last three weeks, and has not used any OTC medications. Pt states she has been more stressed out at work lately. Pt states she has felt somewhat weak and dizzy intermittently over the last month. Pt states she occasionally has blood in her stools. Pt states her grandfather had stomach cancer. Pt denies having past issues with her breasts. Pt states her periods are normal. Pt does not take a multivitamin.   Pt monogamous relationship and is sexually active. Natural family planning using timing and withdrawal, citing cultural reasons for refusing birth control.  Pt has not had a Tdap vaccination.   Notes from previousl HPIs Pt's PCP is Dr. Anitra Lauth at [facility] [place]. Last seen six months ago complaining of six months of vaginal itching. Pt was found to have severe BV and yeast, was  treated with vaginal diflucan. Pt has had no prior lipid panel. Pt had a GI workup three years ago which was normal.  Past Medical History  Diagnosis Date  . Hyperlipidemia     no current med.  . Indigestion     takes OTC as needed  . Hemorrhoids, external 03/2012    symptomatic  . Cough 04/11/2012   Past Surgical History  Procedure Laterality Date  . Band hemorrhoidectomy  01/2012  . Hemorrhoid surgery  04/17/2012    Procedure: HEMORRHOIDECTOMY;  Surgeon: Harl Bowie, MD;  Location: [place];  Service: General;  Laterality: N/A;  2 column external /internal hemorrhoidectomy   No current outpatient prescriptions on file prior to visit.   No current facility-administered medications on file prior to visit.   No Known Allergies Family History  Problem Relation Age of Onset  . Hypertension Mother   . Hypertension Father   . Thyroid disease Father    History   Social History  . Marital Status: Married    Spouse Name: N/A  . Number of Children: N/A  . Years of Education: N/A   Social History Main Topics  . Smoking status: Never Smoker   . Smokeless tobacco: Never Used  . Alcohol Use: No  . Drug Use: No  . Sexual Activity: Not on file   Other Topics Concern  .  None   Social History Narrative   Married, has one son and one daughter.   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   Relocated to [place] from [place] approx 2002.   No T/A/Ds.           Review of Systems  Constitutional: Negative for fever and chills.  Respiratory: Negative for cough and shortness of breath.   Cardiovascular: Negative for chest pain and palpitations.  Gastrointestinal: Positive for constipation and blood in stool. Negative for nausea and abdominal pain.  All other systems reviewed and are negative.      Objective:   Physical Exam  Constitutional: She is oriented to person, place, and time. She appears well-developed and well-nourished. No distress.  HENT:    Head: Normocephalic and atraumatic.  Right Ear: External ear normal.  Left Ear: External ear normal.  Nose: Nose normal.  Mouth/Throat: Oropharynx is clear and moist. No oropharyngeal exudate.  Tm's normal.  Eyes: Conjunctivae are normal. Pupils are equal, round, and reactive to light.  Neck: Normal range of motion. Neck supple.  Cardiovascular: Normal rate, regular rhythm and normal heart sounds.   No murmur heard. Pulmonary/Chest: Effort normal and breath sounds normal. No respiratory distress. She has no wheezes.  Lungs clear to ascultation bilaterally. Good air movement.   Abdominal: Soft. Bowel sounds are normal. She exhibits mass.  LLQ mass, mildly mobil. Suspected large stool in sigmoid. Left sided external hemorrhoid, non thrombosed with no bleeding, around 4 o'clock. Moderate amount of hard stool involved.  Genitourinary:  Normal breast exam. Normal labia majora and minora. Moderate amount of thick white discharge. Cervial with prominent 3-34mm erythemic endocevical polyp protruding from os. Cervical musoca immediately surrounding os friable and rough. Normal bimanual exam. Uterous and adenea normal.  Musculoskeletal: Normal range of motion.  Neurological: She is alert and oriented to person, place, and time. Coordination normal.  Skin: Skin is warm and dry. She is not diaphoretic.  Few cherry angiomas on chest. Large amount of comedones and pustules over face.   Psychiatric: She has a normal mood and affect. Her behavior is normal.  Nursing note and vitals reviewed.  BP 100/60 mmHg  Pulse 62  Temp(Src) 97.6 F (36.4 C) (Oral)  Ht 5\' 5"  (1.651 m)  Wt 137 lb 3.2 oz (62.234 kg)  BMI 22.83 kg/m2  SpO2 100%  LMP 06/26/2014      Assessment & Plan:   Constipation, unspecified constipation type - Plan: POCT CBC, POCT SEDIMENTATION RATE, POCT UA - Microscopic Only, Lipid panel, Comprehensive metabolic panel, TSH, Pap IG and HPV (high risk) DNA detection, IFOBT POC (occult bld,  rslt in office), POCT Wet Prep with KOH, POCT urinalysis dipstick- increase water intake - try to increase diet in protein as well as some salt due to her symptomatic hypotension.  Preventative health care  External hemorrhoid  Vaginal discharge  Acne vulgaris  Meds ordered this encounter  Medications  . polyethylene glycol powder (GLYCOLAX/MIRALAX) powder    Sig: Take 17 g by mouth daily.    Dispense:  225 g    Refill:  11  . senna-docusate (SENOKOT-S) 8.6-50 MG per tablet    Sig: Take 1 tablet by mouth at bedtime.    Dispense:  90 tablet    Refill:  11    I personally performed the services described in this documentation, which was scribed in my presence. The recorded information has been reviewed and considered, and addended by me as needed.  Delman Cheadle, MD MPH

## 2014-07-18 NOTE — Patient Instructions (Addendum)
Increase water - drink at least 8 glasses of water a day - and eat a high salt and high protein diet as well.  Preventive Care for Adults A healthy lifestyle and preventive care can promote health and wellness. Preventive health guidelines for women include the following key practices.  A routine yearly physical is a good way to check with your health care provider about your health and preventive screening. It is a chance to share any concerns and updates on your health and to receive a thorough exam.  Visit your dentist for a routine exam and preventive care every 6 months. Brush your teeth twice a day and floss once a day. Good oral hygiene prevents tooth decay and gum disease.  The frequency of eye exams is based on your age, health, family medical history, use of contact lenses, and other factors. Follow your health care provider's recommendations for frequency of eye exams.  Eat a healthy diet. Foods like vegetables, fruits, whole grains, low-fat dairy products, and lean protein foods contain the nutrients you need without too many calories. Decrease your intake of foods high in solid fats, added sugars, and salt. Eat the right amount of calories for you.Get information about a proper diet from your health care provider, if necessary.  Regular physical exercise is one of the most important things you can do for your health. Most adults should get at least 150 minutes of moderate-intensity exercise (any activity that increases your heart rate and causes you to sweat) each week. In addition, most adults need muscle-strengthening exercises on 2 or more days a week.  Maintain a healthy weight. The body mass index (BMI) is a screening tool to identify possible weight problems. It provides an estimate of body fat based on height and weight. Your health care provider can find your BMI and can help you achieve or maintain a healthy weight.For adults 20 years and older:  A BMI below 18.5 is  considered underweight.  A BMI of 18.5 to 24.9 is normal.  A BMI of 25 to 29.9 is considered overweight.  A BMI of 30 and above is considered obese.  Maintain normal blood lipids and cholesterol levels by exercising and minimizing your intake of saturated fat. Eat a balanced diet with plenty of fruit and vegetables. Blood tests for lipids and cholesterol should begin at age 51 and be repeated every 5 years. If your lipid or cholesterol levels are high, you are over 50, or you are at high risk for heart disease, you may need your cholesterol levels checked more frequently.Ongoing high lipid and cholesterol levels should be treated with medicines if diet and exercise are not working.  If you smoke, find out from your health care provider how to quit. If you do not use tobacco, do not start.  Lung cancer screening is recommended for adults aged 19-80 years who are at high risk for developing lung cancer because of a history of smoking. A yearly low-dose CT scan of the lungs is recommended for people who have at least a 30-pack-year history of smoking and are a current smoker or have quit within the past 15 years. A pack year of smoking is smoking an average of 1 pack of cigarettes a day for 1 year (for example: 1 pack a day for 30 years or 2 packs a day for 15 years). Yearly screening should continue until the smoker has stopped smoking for at least 15 years. Yearly screening should be stopped for people who develop  a health problem that would prevent them from having lung cancer treatment.  If you are pregnant, do not drink alcohol. If you are breastfeeding, be very cautious about drinking alcohol. If you are not pregnant and choose to drink alcohol, do not have more than 1 drink per day. One drink is considered to be 12 ounces (355 mL) of beer, 5 ounces (148 mL) of wine, or 1.5 ounces (44 mL) of liquor.  Avoid use of street drugs. Do not share needles with anyone. Ask for help if you need support or  instructions about stopping the use of drugs.  High blood pressure causes heart disease and increases the risk of stroke. Your blood pressure should be checked at least every 1 to 2 years. Ongoing high blood pressure should be treated with medicines if weight loss and exercise do not work.  If you are 23-75 years old, ask your health care provider if you should take aspirin to prevent strokes.  Diabetes screening involves taking a blood sample to check your fasting blood sugar level. This should be done once every 3 years, after age 7, if you are within normal weight and without risk factors for diabetes. Testing should be considered at a younger age or be carried out more frequently if you are overweight and have at least 1 risk factor for diabetes.  Breast cancer screening is essential preventive care for women. You should practice "breast self-awareness." This means understanding the normal appearance and feel of your breasts and may include breast self-examination. Any changes detected, no matter how small, should be reported to a health care provider. Women in their 79s and 30s should have a clinical breast exam (CBE) by a health care provider as part of a regular health exam every 1 to 3 years. After age 38, women should have a CBE every year. Starting at age 3, women should consider having a mammogram (breast X-ray test) every year. Women who have a family history of breast cancer should talk to their health care provider about genetic screening. Women at a high risk of breast cancer should talk to their health care providers about having an MRI and a mammogram every year.  Breast cancer gene (BRCA)-related cancer risk assessment is recommended for women who have family members with BRCA-related cancers. BRCA-related cancers include breast, ovarian, tubal, and peritoneal cancers. Having family members with these cancers may be associated with an increased risk for harmful changes (mutations) in  the breast cancer genes BRCA1 and BRCA2. Results of the assessment will determine the need for genetic counseling and BRCA1 and BRCA2 testing.  Routine pelvic exams to screen for cancer are no longer recommended for nonpregnant women who are considered low risk for cancer of the pelvic organs (ovaries, uterus, and vagina) and who do not have symptoms. Ask your health care provider if a screening pelvic exam is right for you.  If you have had past treatment for cervical cancer or a condition that could lead to cancer, you need Pap tests and screening for cancer for at least 20 years after your treatment. If Pap tests have been discontinued, your risk factors (such as having a new sexual partner) need to be reassessed to determine if screening should be resumed. Some women have medical problems that increase the chance of getting cervical cancer. In these cases, your health care provider may recommend more frequent screening and Pap tests.  The HPV test is an additional test that may be used for cervical cancer screening.  The HPV test looks for the virus that can cause the cell changes on the cervix. The cells collected during the Pap test can be tested for HPV. The HPV test could be used to screen women aged 48 years and older, and should be used in women of any age who have unclear Pap test results. After the age of 56, women should have HPV testing at the same frequency as a Pap test.  Colorectal cancer can be detected and often prevented. Most routine colorectal cancer screening begins at the age of 100 years and continues through age 76 years. However, your health care provider may recommend screening at an earlier age if you have risk factors for colon cancer. On a yearly basis, your health care provider may provide home test kits to check for hidden blood in the stool. Use of a small camera at the end of a tube, to directly examine the colon (sigmoidoscopy or colonoscopy), can detect the earliest forms  of colorectal cancer. Talk to your health care provider about this at age 4, when routine screening begins. Direct exam of the colon should be repeated every 5-10 years through age 5 years, unless early forms of pre-cancerous polyps or small growths are found.  People who are at an increased risk for hepatitis B should be screened for this virus. You are considered at high risk for hepatitis B if:  You were born in a country where hepatitis B occurs often. Talk with your health care provider about which countries are considered high risk.  Your parents were born in a high-risk country and you have not received a shot to protect against hepatitis B (hepatitis B vaccine).  You have HIV or AIDS.  You use needles to inject street drugs.  You live with, or have sex with, someone who has hepatitis B.  You get hemodialysis treatment.  You take certain medicines for conditions like cancer, organ transplantation, and autoimmune conditions.  Hepatitis C blood testing is recommended for all people born from [birth year] through 1965 and any individual with known risks for hepatitis C.  Practice safe sex. Use condoms and avoid high-risk sexual practices to reduce the spread of sexually transmitted infections (STIs). STIs include gonorrhea, chlamydia, syphilis, trichomonas, herpes, HPV, and human immunodeficiency virus (HIV). Herpes, HIV, and HPV are viral illnesses that have no cure. They can result in disability, cancer, and death.  You should be screened for sexually transmitted illnesses (STIs) including gonorrhea and chlamydia if:  You are sexually active and are younger than 24 years.  You are older than 24 years and your health care provider tells you that you are at risk for this type of infection.  Your sexual activity has changed since you were last screened and you are at an increased risk for chlamydia or gonorrhea. Ask your health care provider if you are at risk.  If you are at risk of  being infected with HIV, it is recommended that you take a prescription medicine daily to prevent HIV infection. This is called preexposure prophylaxis (PrEP). You are considered at risk if:  You are a heterosexual woman, are sexually active, and are at increased risk for HIV infection.  You take drugs by injection.  You are sexually active with a partner who has HIV.  Talk with your health care provider about whether you are at high risk of being infected with HIV. If you choose to begin PrEP, you should first be tested for HIV. You should then be  tested every 3 months for as long as you are taking PrEP.  Osteoporosis is a disease in which the bones lose minerals and strength with aging. This can result in serious bone fractures or breaks. The risk of osteoporosis can be identified using a bone density scan. Women ages 73 years and over and women at risk for fractures or osteoporosis should discuss screening with their health care providers. Ask your health care provider whether you should take a calcium supplement or vitamin D to reduce the rate of osteoporosis.  Menopause can be associated with physical symptoms and risks. Hormone replacement therapy is available to decrease symptoms and risks. You should talk to your health care provider about whether hormone replacement therapy is right for you.  Use sunscreen. Apply sunscreen liberally and repeatedly throughout the day. You should seek shade when your shadow is shorter than you. Protect yourself by wearing long sleeves, pants, a wide-brimmed hat, and sunglasses year round, whenever you are outdoors.  Once a month, do a whole body skin exam, using a mirror to look at the skin on your back. Tell your health care provider of new moles, moles that have irregular borders, moles that are larger than a pencil eraser, or moles that have changed in shape or color.  Stay current with required vaccines (immunizations).  Influenza vaccine. All adults  should be immunized every year.  Tetanus, diphtheria, and acellular pertussis (Td, Tdap) vaccine. Pregnant women should receive 1 dose of Tdap vaccine during each pregnancy. The dose should be obtained regardless of the length of time since the last dose. Immunization is preferred during the 27th-36th week of gestation. An adult who has not previously received Tdap or who does not know her vaccine status should receive 1 dose of Tdap. This initial dose should be followed by tetanus and diphtheria toxoids (Td) booster doses every 10 years. Adults with an unknown or incomplete history of completing a 3-dose immunization series with Td-containing vaccines should begin or complete a primary immunization series including a Tdap dose. Adults should receive a Td booster every 10 years.  Varicella vaccine. An adult without evidence of immunity to varicella should receive 2 doses or a second dose if she has previously received 1 dose. Pregnant females who do not have evidence of immunity should receive the first dose after pregnancy. This first dose should be obtained before leaving the health care facility. The second dose should be obtained 4-8 weeks after the first dose.  Human papillomavirus (HPV) vaccine. Females aged 13-26 years who have not received the vaccine previously should obtain the 3-dose series. The vaccine is not recommended for use in pregnant females. However, pregnancy testing is not needed before receiving a dose. If a female is found to be pregnant after receiving a dose, no treatment is needed. In that case, the remaining doses should be delayed until after the pregnancy. Immunization is recommended for any person with an immunocompromised condition through the age of 78 years if she did not get any or all doses earlier. During the 3-dose series, the second dose should be obtained 4-8 weeks after the first dose. The third dose should be obtained 24 weeks after the first dose and 16 weeks after  the second dose.  Zoster vaccine. One dose is recommended for adults aged 76 years or older unless certain conditions are present.  Measles, mumps, and rubella (MMR) vaccine. Adults born before 40 generally are considered immune to measles and mumps. Adults born in [birth year] or later  should have 1 or more doses of MMR vaccine unless there is a contraindication to the vaccine or there is laboratory evidence of immunity to each of the three diseases. A routine second dose of MMR vaccine should be obtained at least 28 days after the first dose for students attending postsecondary schools, health care workers, or international travelers. People who received inactivated measles vaccine or an unknown type of measles vaccine during 1963-1967 should receive 2 doses of MMR vaccine. People who received inactivated mumps vaccine or an unknown type of mumps vaccine before 1979 and are at high risk for mumps infection should consider immunization with 2 doses of MMR vaccine. For females of childbearing age, rubella immunity should be determined. If there is no evidence of immunity, females who are not pregnant should be vaccinated. If there is no evidence of immunity, females who are pregnant should delay immunization until after pregnancy. Unvaccinated health care workers born before [birth year] who lack laboratory evidence of measles, mumps, or rubella immunity or laboratory confirmation of disease should consider measles and mumps immunization with 2 doses of MMR vaccine or rubella immunization with 1 dose of MMR vaccine.  Pneumococcal 13-valent conjugate (PCV13) vaccine. When indicated, a person who is uncertain of her immunization history and has no record of immunization should receive the PCV13 vaccine. An adult aged 29 years or older who has certain medical conditions and has not been previously immunized should receive 1 dose of PCV13 vaccine. This PCV13 should be followed with a dose of pneumococcal polysaccharide (PPSV23)  vaccine. The PPSV23 vaccine dose should be obtained at least 8 weeks after the dose of PCV13 vaccine. An adult aged 69 years or older who has certain medical conditions and previously received 1 or more doses of PPSV23 vaccine should receive 1 dose of PCV13. The PCV13 vaccine dose should be obtained 1 or more years after the last PPSV23 vaccine dose.  Pneumococcal polysaccharide (PPSV23) vaccine. When PCV13 is also indicated, PCV13 should be obtained first. All adults aged 73 years and older should be immunized. An adult younger than age 75 years who has certain medical conditions should be immunized. Any person who resides in a nursing home or long-term care facility should be immunized. An adult smoker should be immunized. People with an immunocompromised condition and certain other conditions should receive both PCV13 and PPSV23 vaccines. People with human immunodeficiency virus (HIV) infection should be immunized as soon as possible after diagnosis. Immunization during chemotherapy or radiation therapy should be avoided. Routine use of PPSV23 vaccine is not recommended for American Indians, [place]atives, or people younger than 65 years unless there are medical conditions that require PPSV23 vaccine. When indicated, people who have unknown immunization and have no record of immunization should receive PPSV23 vaccine. One-time revaccination 5 years after the first dose of PPSV23 is recommended for people aged 19-64 years who have chronic kidney failure, nephrotic syndrome, asplenia, or immunocompromised conditions. People who received 1-2 doses of PPSV23 before age 32 years should receive another dose of PPSV23 vaccine at age 48 years or later if at least 5 years have passed since the previous dose. Doses of PPSV23 are not needed for people immunized with PPSV23 at or after age 34 years.  Meningococcal vaccine. Adults with asplenia or persistent complement component deficiencies should receive 2 doses of  quadrivalent meningococcal conjugate (MenACWY-D) vaccine. The doses should be obtained at least 2 months apart. Microbiologists working with certain meningococcal bacteria, [employer] recruits, people at risk during an outbreak,  and people who travel to or live in countries with a high rate of meningitis should be immunized. A first-year college student up through age 8 years who is living in a residence hall should receive a dose if she did not receive a dose on or after her 16th birthday. Adults who have certain high-risk conditions should receive one or more doses of vaccine.  Hepatitis A vaccine. Adults who wish to be protected from this disease, have certain high-risk conditions, work with hepatitis A-infected animals, work in hepatitis A research labs, or travel to or work in countries with a high rate of hepatitis A should be immunized. Adults who were previously unvaccinated and who anticipate close contact with an international adoptee during the first 60 days after arrival in the [place] from a country with a high rate of hepatitis A should be immunized.  Hepatitis B vaccine. Adults who wish to be protected from this disease, have certain high-risk conditions, may be exposed to blood or other infectious body fluids, are household contacts or sex partners of hepatitis B positive people, are clients or workers in certain care facilities, or travel to or work in countries with a high rate of hepatitis B should be immunized.  Haemophilus influenzae type b (Hib) vaccine. A previously unvaccinated person with asplenia or sickle cell disease or having a scheduled splenectomy should receive 1 dose of Hib vaccine. Regardless of previous immunization, a recipient of a hematopoietic stem cell transplant should receive a 3-dose series 6-12 months after her successful transplant. Hib vaccine is not recommended for adults with HIV infection. Preventive Services / Frequency Ages 52 to 52 years  Blood  pressure check.** / Every 1 to 2 years.  Lipid and cholesterol check.** / Every 5 years beginning at age 36.  Clinical breast exam.** / Every 3 years for women in their 67s and 66s.  BRCA-related cancer risk assessment.** / For women who have family members with a BRCA-related cancer (breast, ovarian, tubal, or peritoneal cancers).  Pap test.** / Every 2 years from ages 21 through 58. Every 3 years starting at age 43 through age 21 or 49 with a history of 3 consecutive normal Pap tests.  HPV screening.** / Every 3 years from ages 1 through ages 40 to 2 with a history of 3 consecutive normal Pap tests.  Hepatitis C blood test.** / For any individual with known risks for hepatitis C.  Skin self-exam. / Monthly.  Influenza vaccine. / Every year.  Tetanus, diphtheria, and acellular pertussis (Tdap, Td) vaccine.** / Consult your health care provider. Pregnant women should receive 1 dose of Tdap vaccine during each pregnancy. 1 dose of Td every 10 years.  Varicella vaccine.** / Consult your health care provider. Pregnant females who do not have evidence of immunity should receive the first dose after pregnancy.  HPV vaccine. / 3 doses over 6 months, if 66 and younger. The vaccine is not recommended for use in pregnant females. However, pregnancy testing is not needed before receiving a dose.  Measles, mumps, rubella (MMR) vaccine.** / You need at least 1 dose of MMR if you were born in 1957 or later. You may also need a 2nd dose. For females of childbearing age, rubella immunity should be determined. If there is no evidence of immunity, females who are not pregnant should be vaccinated. If there is no evidence of immunity, females who are pregnant should delay immunization until after pregnancy.  Pneumococcal 13-valent conjugate (PCV13) vaccine.** / Consult your health  care provider.  Pneumococcal polysaccharide (PPSV23) vaccine.** / 1 to 2 doses if you smoke cigarettes or if you have certain  conditions.  Meningococcal vaccine.** / 1 dose if you are age 21 to 82 years and a Market researcher living in a residence hall, or have one of several medical conditions, you need to get vaccinated against meningococcal disease. You may also need additional booster doses.  Hepatitis A vaccine.** / Consult your health care provider.  Hepatitis B vaccine.** / Consult your health care provider.  Haemophilus influenzae type b (Hib) vaccine.** / Consult your health care provider. Ages 79 to 39 years  Blood pressure check.** / Every 1 to 2 years.  Lipid and cholesterol check.** / Every 5 years beginning at age 65 years.  Lung cancer screening. / Every year if you are aged 69-80 years and have a 30-pack-year history of smoking and currently smoke or have quit within the past 15 years. Yearly screening is stopped once you have quit smoking for at least 15 years or develop a health problem that would prevent you from having lung cancer treatment.  Clinical breast exam.** / Every year after age 56 years.  BRCA-related cancer risk assessment.** / For women who have family members with a BRCA-related cancer (breast, ovarian, tubal, or peritoneal cancers).  Mammogram.** / Every year beginning at age 22 years and continuing for as long as you are in good health. Consult with your health care provider.  Pap test.** / Every 3 years starting at age 45 years through age 71 or 71 years with a history of 3 consecutive normal Pap tests.  HPV screening.** / Every 3 years from ages 85 years through ages 34 to 86 years with a history of 3 consecutive normal Pap tests.  Fecal occult blood test (FOBT) of stool. / Every year beginning at age 18 years and continuing until age 25 years. You may not need to do this test if you get a colonoscopy every 10 years.  Flexible sigmoidoscopy or colonoscopy.** / Every 5 years for a flexible sigmoidoscopy or every 10 years for a colonoscopy beginning at age 65 years  and continuing until age 29 years.  Hepatitis C blood test.** / For all people born from [birth year] through 1967 and any individual with known risks for hepatitis C.  Skin self-exam. / Monthly.  Influenza vaccine. / Every year.  Tetanus, diphtheria, and acellular pertussis (Tdap/Td) vaccine.** / Consult your health care provider. Pregnant women should receive 1 dose of Tdap vaccine during each pregnancy. 1 dose of Td every 10 years.  Varicella vaccine.** / Consult your health care provider. Pregnant females who do not have evidence of immunity should receive the first dose after pregnancy.  Zoster vaccine.** / 1 dose for adults aged 9 years or older.  Measles, mumps, rubella (MMR) vaccine.** / You need at least 1 dose of MMR if you were born in 1957 or later. You may also need a 2nd dose. For females of childbearing age, rubella immunity should be determined. If there is no evidence of immunity, females who are not pregnant should be vaccinated. If there is no evidence of immunity, females who are pregnant should delay immunization until after pregnancy.  Pneumococcal 13-valent conjugate (PCV13) vaccine.** / Consult your health care provider.  Pneumococcal polysaccharide (PPSV23) vaccine.** / 1 to 2 doses if you smoke cigarettes or if you have certain conditions.  Meningococcal vaccine.** / Consult your health care provider.  Hepatitis A vaccine.** / Consult your health care  provider.  Hepatitis B vaccine.** / Consult your health care provider.  Haemophilus influenzae type b (Hib) vaccine.** / Consult your health care provider. Ages 12 years and over  Blood pressure check.** / Every 1 to 2 years.  Lipid and cholesterol check.** / Every 5 years beginning at age 32 years.  Lung cancer screening. / Every year if you are aged 29-80 years and have a 30-pack-year history of smoking and currently smoke or have quit within the past 15 years. Yearly screening is stopped once you have quit smoking  for at least 15 years or develop a health problem that would prevent you from having lung cancer treatment.  Clinical breast exam.** / Every year after age 32 years.  BRCA-related cancer risk assessment.** / For women who have family members with a BRCA-related cancer (breast, ovarian, tubal, or peritoneal cancers).  Mammogram.** / Every year beginning at age 54 years and continuing for as long as you are in good health. Consult with your health care provider.  Pap test.** / Every 3 years starting at age 67 years through age 65 or 79 years with 3 consecutive normal Pap tests. Testing can be stopped between 65 and 70 years with 3 consecutive normal Pap tests and no abnormal Pap or HPV tests in the past 10 years.  HPV screening.** / Every 3 years from ages 20 years through ages 11 or 10 years with a history of 3 consecutive normal Pap tests. Testing can be stopped between 65 and 70 years with 3 consecutive normal Pap tests and no abnormal Pap or HPV tests in the past 10 years.  Fecal occult blood test (FOBT) of stool. / Every year beginning at age 11 years and continuing until age 24 years. You may not need to do this test if you get a colonoscopy every 10 years.  Flexible sigmoidoscopy or colonoscopy.** / Every 5 years for a flexible sigmoidoscopy or every 10 years for a colonoscopy beginning at age 65 years and continuing until age 2 years.  Hepatitis C blood test.** / For all people born from [birth year] through 1967 and any individual with known risks for hepatitis C.  Osteoporosis screening.** / A one-time screening for women ages 71 years and over and women at risk for fractures or osteoporosis.  Skin self-exam. / Monthly.  Influenza vaccine. / Every year.  Tetanus, diphtheria, and acellular pertussis (Tdap/Td) vaccine.** / 1 dose of Td every 10 years.  Varicella vaccine.** / Consult your health care provider.  Zoster vaccine.** / 1 dose for adults aged 4 years or older.  Pneumococcal  13-valent conjugate (PCV13) vaccine.** / Consult your health care provider.  Pneumococcal polysaccharide (PPSV23) vaccine.** / 1 dose for all adults aged 93 years and older.  Meningococcal vaccine.** / Consult your health care provider.  Hepatitis A vaccine.** / Consult your health care provider.  Hepatitis B vaccine.** / Consult your health care provider.  Haemophilus influenzae type b (Hib) vaccine.** / Consult your health care provider. ** Family history and personal history of risk and conditions may change your health care provider's recommendations. Document Released: 06/13/2001 Document Revised: 09/01/2013 Document Reviewed: 09/12/2010 [facility] Patient Information 2015 [place], [place]. This information is not intended to replace advice given to you by your health care provider. Make sure you discuss any questions you have with your health care provider. Introduction to Bowel Health Diet and daily habits can help you predict when your bowels will move on a regular basis.  The consistency and quantity of the  stool is usually more important than the frequency.  The goal is to have a regular bowel movement that is soft but formed.   Tips on Emptying Regularly . Eat breakfast.  Usually the best time of day for a bowel movement will be a half hour to an hour after eating.  These times are best because the body uses the gastrocolic reflex, a stimulation of bowel motion that occurs with eating, to help produce a bowel movement.  For some people even a simple hot drink in the morning can help the reflex action begin. . Eat all your meals at a predictable time each day.  The bowel functions best when food is introduced at the same regular intervals. . The amount of food eaten at a given time of day should be about the same size from day to day.  The bowel functions best when food is introduced in similar quantities from day to day. It is fine to have a small breakfast and a large lunch, or vice  versa, just be consistent. . Eat two servings of fruit or vegetables and at least one serving of a complex carbohydrates (whole grains such as brown rice, bran, whole wheat bread, or oatmeal) at each meal. . Drink plenty of water-ideally eight glasses a day.  Be sure to increase your water intake if you are increasing fiber into your diet.  Maintain Healthy Habits . Exercise daily.  You may exercise at any time of day, but you may find that bowel function is helped most if the exercise is at a consistent time each day. . Make sure that you are not rushed and have convenient access to a bathroom at your selected time to empty your bowels.    2007, [employer] Doc.14 About Constipation  Constipation Overview Constipation is the most common gastrointestinal complaint - about 4 million Americans experience constipation and make 2.5 million physician visits a year to get help for the problem.  Constipation can occur when the colon absorbs too much water, the colon's muscle contraction is slow or sluggish, and/or there is delayed transit time through the colon.  The result is stool that is hard and dry.  Indicators of constipation include straining during bowel movements greater than 25% of the time, having fewer than three bowel movements per week, and/or the feeling of incomplete evacuation.  There are established guidelines ([place] II ) for defining constipation. A person needs to have two or more of the following symptoms for at least 12 weeks (not necessarily consecutive) in the preceding 12 months: . Straining in  greater than 25% of bowel movements . Lumpy or hard stools in greater than 25% of bowel movements . Sensation of incomplete emptying in greater than 25% of bowel movements . Sensation of anorectal obstruction/blockade in greater than 25% of bowel movements . Manual maneuvers to help empty greater than 25% of bowel movements (e.g., digital evacuation, support of the pelvic  floor)  . Less than  3 bowel movements/week . Loose stools are not present, and criteria for irritable bowel syndrome are insufficient  Common Causes of Constipation . Lack of fiber in your diet . Lack of physical activity . Medications, including iron and calcium supplements  . Dairy intake . Dehydration . Abuse of laxatives  Travel  Irritable Bowel Syndrome  Pregnancy  Luteal phase of menstruation (after ovulation and before menses)  Colorectal problems  Intestinal Dysfunction  Treating Constipation  There are several ways of treating constipation, including changes to  diet and exercise, use of laxatives, adjustments to the pelvic floor, and scheduled toileting.  These treatments include: . increasing fiber and fluids in the diet  . increasing physical activity . learning muscle coordination   learning proper toileting techniques and toileting modifications   designing and sticking  to a toileting schedule     2007, [employer] Doc.22  Constipation Constipation is when a person has fewer than three bowel movements a week, has difficulty having a bowel movement, or has stools that are dry, hard, or larger than normal. As people grow older, constipation is more common. If you try to fix constipation with medicines that make you have a bowel movement (laxatives), the problem may get worse. Long-term laxative use may cause the muscles of the colon to become weak. A low-fiber diet, not taking in enough fluids, and taking certain medicines may make constipation worse.  CAUSES   Certain medicines, such as antidepressants, pain medicine, iron supplements, antacids, and water pills.   Certain diseases, such as diabetes, irritable bowel syndrome (IBS), thyroid disease, or depression.   Not drinking enough water.   Not eating enough fiber-rich foods.   Stress or travel.   Lack of physical activity or exercise.   Ignoring the urge to have a bowel  movement.   Using laxatives too much.  SIGNS AND SYMPTOMS   Having fewer than three bowel movements a week.   Straining to have a bowel movement.   Having stools that are hard, dry, or larger than normal.   Feeling full or bloated.   Pain in the lower abdomen.   Not feeling relief after having a bowel movement.  DIAGNOSIS  Your health care provider will take a medical history and perform a physical exam. Further testing may be done for severe constipation. Some tests may include:  A barium enema X-ray to examine your rectum, colon, and, sometimes, your small intestine.   A sigmoidoscopy to examine your lower colon.   A colonoscopy to examine your entire colon. TREATMENT  Treatment will depend on the severity of your constipation and what is causing it. Some dietary treatments include drinking more fluids and eating more fiber-rich foods. Lifestyle treatments may include regular exercise. If these diet and lifestyle recommendations do not help, your health care provider may recommend taking over-the-counter laxative medicines to help you have bowel movements. Prescription medicines may be prescribed if over-the-counter medicines do not work.  HOME CARE INSTRUCTIONS   Eat foods that have a lot of fiber, such as fruits, vegetables, whole grains, and beans.  Limit foods high in fat and processed sugars, such as french fries, hamburgers, cookies, candies, and soda.   A fiber supplement may be added to your diet if you cannot get enough fiber from foods.   Drink enough fluids to keep your urine clear or pale yellow.   Exercise regularly or as directed by your health care provider.   Go to the restroom when you have the urge to go. Do not hold it.   Only take over-the-counter or prescription medicines as directed by your health care provider. Do not take other medicines for constipation without talking to your health care provider first.  [place]  IF:   You have bright red blood in your stool.   Your constipation lasts for more than 4 days or gets worse.   You have abdominal or rectal pain.   You have thin, pencil-like stools.   You have unexplained weight loss. MAKE  SURE YOU:   Understand these instructions.  Will watch your condition.  Will get help right away if you are not doing well or get worse. Document Released: 01/14/2004 Document Revised: 04/22/2013 Document Reviewed: 01/27/2013 [facility] Patient Information 2015 [place], [place]. This information is not intended to replace advice given to you by your health care provider. Make sure you discuss any questions you have with your health care provider. Acne Acne is a skin problem that causes pimples. Acne occurs when the pores in your skin get blocked. Your pores may become red, sore, and swollen (inflamed), or infected with a common skin bacterium (Propionibacterium acnes). Acne is a common skin problem. Up to 80% of people get acne at some time. Acne is especially common from the ages of 43 to 71. Acne usually goes away over time with proper treatment. CAUSES  Your pores each contain an oil gland. The oil glands make an oily substance called sebum. Acne happens when these glands get plugged with sebum, dead skin cells, and dirt. The P. acnes bacteria that are normally found in the oil glands then multiply, causing inflammation. Acne is commonly triggered by changes in your hormones. These hormonal changes can cause the oil glands to get bigger and to make more sebum. Factors that can make acne worse include:  Hormone changes during adolescence.  Hormone changes during women's menstrual cycles.  Hormone changes during pregnancy.  Oil-based cosmetics and hair products.  Harshly scrubbing the skin.  Strong soaps.  Stress.  Hormone problems due to certain diseases.  Long or oily hair rubbing against the skin.  Certain medicines.  Pressure from headbands,  backpacks, or shoulder pads.  Exposure to certain oils and chemicals. SYMPTOMS  Acne often occurs on the face, neck, chest, and upper back. Symptoms include:  Small, red bumps (pimples or papules).  Whiteheads (closed comedones).  Blackheads (open comedones).  Small, pus-filled pimples (pustules).  Big, red pimples or pustules that feel tender. More severe acne can cause:  An infected area that contains a collection of pus (abscess).  Hard, painful, fluid-filled sacs (cysts).  Scars. DIAGNOSIS  Your caregiver can usually tell what the problem is by doing a physical exam. TREATMENT  There are many good treatments for acne. Some are available over the counter and some are available with a prescription. The treatment that is best for you depends on the type of acne you have and how severe it is. It may take 2 months of treatment before your acne gets better. Common treatments include:  Creams and lotions that prevent oil glands from clogging.  Creams and lotions that treat or prevent infections and inflammation.  Antibiotics applied to the skin or taken as a pill.  Pills that decrease sebum production.  Birth control pills.  Light or laser treatments.  Minor surgery.  Injections of medicine into the affected areas.  Chemicals that cause peeling of the skin. HOME CARE INSTRUCTIONS  Good skin care is the most important part of treatment.  Wash your skin gently at least twice a day and after exercise. Always wash your skin before bed.  Use mild soap.  After each wash, apply a water-based skin moisturizer.  Keep your hair clean and off of your face. Shampoo your hair daily.  Only take medicines as directed by your caregiver.  Use a sunscreen or sunblock with SPF 30 or greater. This is especially important when you are using acne medicines.  Choose cosmetics that are noncomedogenic. This means they do not  plug the oil glands.  Avoid leaning your chin or forehead  on your hands.  Avoid wearing tight headbands or hats.  Avoid picking or squeezing your pimples. This can make your acne worse and cause scarring. SEEK MEDICAL CARE IF:   Your acne is not better after 8 weeks.  Your acne gets worse.  You have a large area of skin that is red or tender. Document Released: 04/14/2000 Document Revised: 09/01/2013 Document Reviewed: 02/03/2011 [facility] Patient Information 2015 [place], [place]. This information is not intended to replace advice given to you by your health care provider. Make sure you discuss any questions you have with your health care provider.

## 2014-07-19 LAB — TSH: TSH: 1.284 u[IU]/mL (ref 0.350–4.500)

## 2014-07-21 LAB — PAP IG AND HPV HIGH-RISK: HPV DNA High Risk: DETECTED — AB

## 2014-08-20 ENCOUNTER — Encounter: Payer: Self-pay | Admitting: Family Medicine

## 2014-08-20 ENCOUNTER — Ambulatory Visit (INDEPENDENT_AMBULATORY_CARE_PROVIDER_SITE_OTHER): Payer: Self-pay | Admitting: Family Medicine

## 2014-08-20 VITALS — BP 101/64 | HR 61 | Temp 99.0°F | Resp 18 | Ht 65.0 in | Wt 136.0 lb

## 2014-08-20 DIAGNOSIS — B3731 Acute candidiasis of vulva and vagina: Secondary | ICD-10-CM

## 2014-08-20 DIAGNOSIS — B373 Candidiasis of vulva and vagina: Secondary | ICD-10-CM

## 2014-08-20 MED ORDER — FLUCONAZOLE 150 MG PO TABS: ORAL_TABLET | ORAL | Status: DC

## 2014-08-20 NOTE — Progress Notes (Signed)
OFFICE NOTE  08/20/2014  CC:  Chief Complaint  Patient presents with  . Vaginal Itching    x3 weeks   HPI: Patient is a 34 y.o. [place] female who is here w/out interpreter but speaks english relatively well.  She is here for 3 week history of vaginal itching, she says it feels like the area on vulva is the site of itching, not in vagina.  No rash.  Small amount of white discharge that is "thin". LMP 07/24/14, regular menses.  Denies vaginal odor/no fishy odor. No recent antibiotic use.  Pertinent PMH:  Past medical, surgical, social, and family history reviewed and no changes are noted since last office visit.  MEDS:  Outpatient Prescriptions Prior to Visit  Medication Sig Dispense Refill  . polyethylene glycol powder (GLYCOLAX/MIRALAX) powder Take 17 g by mouth daily. (Patient not taking: Reported on 08/20/2014) 225 g 11  . senna-docusate (SENOKOT-S) 8.6-50 MG per tablet Take 1 tablet by mouth at bedtime. (Patient not taking: Reported on 08/20/2014) 90 tablet 11   No facility-administered medications prior to visit.    PE: Blood pressure 101/64, pulse 61, temperature 99 F (37.2 C), temperature source Temporal, resp. rate 18, height 5\' 5"  (1.651 m), weight 136 lb (61.689 kg), SpO2 98 %. Pt examined with Jacklynn Ganong, CMA, as chaperone. Vaginal exam: vulva with mild erythematous papular rash that is distributed evenly lateral to the labia majora, with deep erythematous hue to the visible vaginal mucosa.  No discharge, no internal exam was done with speculum today, no bimanual exam done today, no wet prep done today.  LAB: none  IMPRESSION AND PLAN:  1) Yeast vulvovaginitis; recurrent and prolonged.  Today I rx'd diflucan 150mg  qd x 7d. Signs/symptoms to call or return for were reviewed and pt expressed understanding.  An After Visit Summary was printed and given to the patient.  FOLLOW UP: prn

## 2014-08-20 NOTE — Progress Notes (Signed)
Pre visit review using our clinic review tool, if applicable. No additional management support is needed unless otherwise documented below in the visit note. 

## 2014-08-26 ENCOUNTER — Encounter: Payer: Self-pay | Admitting: Family Medicine

## 2014-08-26 DIAGNOSIS — IMO0002 Reserved for concepts with insufficient information to code with codable children: Secondary | ICD-10-CM | POA: Insufficient documentation

## 2014-08-26 DIAGNOSIS — L700 Acne vulgaris: Secondary | ICD-10-CM | POA: Insufficient documentation

## 2014-08-26 NOTE — Addendum Note (Signed)
Addended by: Delman Cheadle on: 08/26/2014 02:44 AM   Modules accepted: Orders, SmartSet

## 2014-09-17 ENCOUNTER — Ambulatory Visit: Payer: Self-pay | Admitting: Gynecology

## 2014-10-01 ENCOUNTER — Ambulatory Visit: Payer: Self-pay | Admitting: Gynecology

## 2014-12-25 ENCOUNTER — Other Ambulatory Visit: Payer: Self-pay

## 2014-12-25 ENCOUNTER — Other Ambulatory Visit: Payer: Self-pay | Admitting: Internal Medicine

## 2014-12-25 ENCOUNTER — Ambulatory Visit
Admission: RE | Admit: 2014-12-25 | Discharge: 2014-12-25 | Disposition: A | Discharge status: Home / Self Care | Payer: No Typology Code available for payment source | Source: Ambulatory Visit | Attending: Internal Medicine | Admitting: Internal Medicine

## 2014-12-25 DIAGNOSIS — R1900 Intra-abdominal and pelvic swelling, mass and lump, unspecified site: Secondary | ICD-10-CM

## 2015-10-12 ENCOUNTER — Other Ambulatory Visit: Payer: Self-pay | Admitting: Obstetrics & Gynecology

## 2015-10-12 ENCOUNTER — Other Ambulatory Visit (HOSPITAL_COMMUNITY)
Admission: RE | Admit: 2015-10-12 | Discharge: 2015-10-12 | Disposition: A | Discharge status: Home / Self Care | Payer: No Typology Code available for payment source | Source: Ambulatory Visit | Attending: Obstetrics & Gynecology | Admitting: Obstetrics & Gynecology

## 2015-10-12 DIAGNOSIS — Z01419 Encounter for gynecological examination (general) (routine) without abnormal findings: Secondary | ICD-10-CM | POA: Insufficient documentation

## 2015-10-12 DIAGNOSIS — Z1151 Encounter for screening for human papillomavirus (HPV): Secondary | ICD-10-CM | POA: Insufficient documentation

## 2015-10-13 LAB — CYTOLOGY - PAP

## 2016-01-18 ENCOUNTER — Other Ambulatory Visit (HOSPITAL_COMMUNITY): Payer: Self-pay | Admitting: *Deleted

## 2016-01-18 DIAGNOSIS — N6452 Nipple discharge: Secondary | ICD-10-CM

## 2016-01-18 DIAGNOSIS — N6310 Unspecified lump in the right breast, unspecified quadrant: Secondary | ICD-10-CM

## 2016-01-19 ENCOUNTER — Other Ambulatory Visit: Payer: Self-pay | Admitting: Obstetrics and Gynecology

## 2016-01-19 ENCOUNTER — Other Ambulatory Visit (HOSPITAL_COMMUNITY): Payer: Self-pay | Admitting: Obstetrics and Gynecology

## 2016-01-19 DIAGNOSIS — N6452 Nipple discharge: Secondary | ICD-10-CM

## 2016-01-19 DIAGNOSIS — N6310 Unspecified lump in the right breast, unspecified quadrant: Secondary | ICD-10-CM

## 2016-01-27 ENCOUNTER — Ambulatory Visit
Admission: RE | Admit: 2016-01-27 | Discharge: 2016-01-27 | Disposition: A | Discharge status: Home / Self Care | Payer: No Typology Code available for payment source | Source: Ambulatory Visit | Attending: Obstetrics and Gynecology | Admitting: Obstetrics and Gynecology

## 2016-01-27 ENCOUNTER — Encounter (HOSPITAL_COMMUNITY): Payer: Self-pay

## 2016-01-27 ENCOUNTER — Ambulatory Visit (HOSPITAL_COMMUNITY)
Admission: RE | Admit: 2016-01-27 | Discharge: 2016-01-27 | Disposition: A | Discharge status: Home / Self Care | Payer: No Typology Code available for payment source | Source: Ambulatory Visit | Attending: Obstetrics and Gynecology | Admitting: Obstetrics and Gynecology

## 2016-01-27 VITALS — BP 102/68 | Temp 98.1°F | Ht 62.0 in | Wt 115.4 lb

## 2016-01-27 DIAGNOSIS — N6452 Nipple discharge: Secondary | ICD-10-CM

## 2016-01-27 DIAGNOSIS — N6310 Unspecified lump in the right breast, unspecified quadrant: Secondary | ICD-10-CM

## 2016-01-27 DIAGNOSIS — Z1239 Encounter for other screening for malignant neoplasm of breast: Secondary | ICD-10-CM

## 2016-01-27 NOTE — Patient Instructions (Signed)
Explained breast self awareness to Anna Chen. Patient did not need a Pap smear today due to last Pap smear and HPV typing was 10/12/2015. Let her know [facility] will cover Pap smears and HPV typing every 5 years unless has a history of abnormal Pap smears. Referred patient to the [place] [facility] for diagnostic mammogram. Appointment scheduled for Thursday, January 27, 2016 at 1110.  Anna Chen verbalized understanding.  Gerasimos Plotts, Arvil Chaco, RN 1:07 PM

## 2016-01-27 NOTE — Addendum Note (Signed)
Encounter addended by: Loletta Parish, RN on: 01/27/2016  1:44 PM<BR>    Actions taken: Sign clinical note

## 2016-01-27 NOTE — Progress Notes (Addendum)
Complaints of right breast discharge x one month when expresses.  Pap Smear:  Pap smear not completed today. Last Pap smear was 10/12/2015 at [facility] and normal with negative HPV. Patient has a history of one abnormal Pap smear 07/18/2014 that was ASCUS with positive HPV. Per patient that is the only abnormal Pap smear she has had. Patients last three Pap smear results are in EPIC.  Physical exam: Breasts Breasts symmetrical. No skin abnormalities bilateral breasts. No nipple retraction bilateral breasts. Expressed milky discharge from bilateral breasts that was greater right breast. Sent sample to cytology for right breast. Unable to express enough discharge to sent sample of left breast. No lymphadenopathy. No lumps palpated bilateral breasts. No complaints of pain or tenderness on exam. Referred patient to the [facility] for diagnostic mammogram. Appointment scheduled for Thursday, January 27, 2016 at 1110.        Pelvic/Bimanual No Pap smear completed today since last Pap smear was 10/12/2015. Pap smear not indicated per [facility]CCCP guidelines.   Smoking History: Patient has never smoked.  Patient Navigation: Patient education provided. Access to services provided for patient through [place] interpreter provided.    Used [place] interpreter Valinda Hoar from [employer].

## 2016-02-01 ENCOUNTER — Encounter (HOSPITAL_COMMUNITY): Payer: Self-pay | Admitting: *Deleted

## 2016-02-07 ENCOUNTER — Other Ambulatory Visit: Payer: Self-pay | Admitting: Surgery

## 2016-02-09 ENCOUNTER — Other Ambulatory Visit: Payer: Self-pay | Admitting: Surgery

## 2016-02-09 DIAGNOSIS — N6452 Nipple discharge: Secondary | ICD-10-CM

## 2016-02-22 ENCOUNTER — Ambulatory Visit
Admission: RE | Admit: 2016-02-22 | Discharge: 2016-02-22 | Disposition: A | Discharge status: Home / Self Care | Payer: No Typology Code available for payment source | Source: Ambulatory Visit | Attending: Surgery | Admitting: Surgery

## 2016-02-22 DIAGNOSIS — N6452 Nipple discharge: Secondary | ICD-10-CM

## 2016-02-22 MED ORDER — GADOBENATE DIMEGLUMINE 529 MG/ML IV SOLN
10.0000 mL | Freq: Once | INTRAVENOUS | Status: AC | PRN
  Administered 2016-02-22: 10 mL via INTRAVENOUS

## 2016-03-02 ENCOUNTER — Other Ambulatory Visit: Payer: Self-pay | Admitting: Surgery

## 2016-03-02 DIAGNOSIS — R928 Other abnormal and inconclusive findings on diagnostic imaging of breast: Secondary | ICD-10-CM

## 2016-03-09 ENCOUNTER — Other Ambulatory Visit: Payer: Self-pay | Admitting: Surgery

## 2016-03-09 ENCOUNTER — Ambulatory Visit
Admission: RE | Admit: 2016-03-09 | Discharge: 2016-03-09 | Disposition: A | Discharge status: Home / Self Care | Payer: No Typology Code available for payment source | Source: Ambulatory Visit | Attending: Surgery | Admitting: Surgery

## 2016-03-09 DIAGNOSIS — R928 Other abnormal and inconclusive findings on diagnostic imaging of breast: Secondary | ICD-10-CM

## 2016-03-09 MED ORDER — GADOBENATE DIMEGLUMINE 529 MG/ML IV SOLN
10.0000 mL | Freq: Once | INTRAVENOUS | Status: AC | PRN
  Administered 2016-03-09: 10 mL via INTRAVENOUS

## 2016-03-15 ENCOUNTER — Other Ambulatory Visit: Payer: Self-pay | Admitting: Surgery

## 2016-05-01 HISTORY — PX: BREAST EXCISIONAL BIOPSY: SUR124

## 2016-09-12 ENCOUNTER — Other Ambulatory Visit: Payer: Self-pay | Admitting: Surgery

## 2016-09-12 DIAGNOSIS — N6452 Nipple discharge: Secondary | ICD-10-CM

## 2016-09-13 ENCOUNTER — Encounter: Payer: Self-pay | Admitting: Gynecology

## 2016-09-15 ENCOUNTER — Other Ambulatory Visit: Payer: Self-pay | Admitting: Surgery

## 2016-09-15 DIAGNOSIS — N6452 Nipple discharge: Secondary | ICD-10-CM

## 2016-09-20 ENCOUNTER — Ambulatory Visit
Admission: RE | Admit: 2016-09-20 | Discharge: 2016-09-20 | Disposition: A | Discharge status: Home / Self Care | Payer: No Typology Code available for payment source | Source: Ambulatory Visit | Attending: Surgery | Admitting: Surgery

## 2016-09-20 DIAGNOSIS — N6452 Nipple discharge: Secondary | ICD-10-CM

## 2016-09-20 HISTORY — DX: Nipple discharge: N64.52

## 2016-12-31 ENCOUNTER — Ambulatory Visit
Admission: RE | Admit: 2016-12-31 | Discharge: 2016-12-31 | Disposition: A | Discharge status: Home / Self Care | Payer: No Typology Code available for payment source | Source: Ambulatory Visit | Attending: Surgery | Admitting: Surgery

## 2016-12-31 ENCOUNTER — Other Ambulatory Visit: Payer: No Typology Code available for payment source

## 2016-12-31 DIAGNOSIS — N6452 Nipple discharge: Secondary | ICD-10-CM

## 2016-12-31 MED ORDER — GADOBENATE DIMEGLUMINE 529 MG/ML IV SOLN
10.0000 mL | Freq: Once | INTRAVENOUS | Status: AC | PRN
  Administered 2016-12-31: 10 mL via INTRAVENOUS

## 2017-01-10 ENCOUNTER — Other Ambulatory Visit: Payer: Self-pay | Admitting: Surgery

## 2017-01-10 DIAGNOSIS — N6452 Nipple discharge: Secondary | ICD-10-CM

## 2017-01-11 ENCOUNTER — Other Ambulatory Visit: Payer: Self-pay | Admitting: Surgery

## 2017-01-11 DIAGNOSIS — N63 Unspecified lump in unspecified breast: Secondary | ICD-10-CM

## 2017-01-11 DIAGNOSIS — N6452 Nipple discharge: Secondary | ICD-10-CM

## 2017-01-12 ENCOUNTER — Ambulatory Visit
Admission: RE | Admit: 2017-01-12 | Discharge: 2017-01-12 | Disposition: A | Discharge status: Home / Self Care | Payer: No Typology Code available for payment source | Source: Ambulatory Visit | Attending: Surgery | Admitting: Surgery

## 2017-01-12 DIAGNOSIS — N63 Unspecified lump in unspecified breast: Secondary | ICD-10-CM

## 2017-01-12 DIAGNOSIS — N6452 Nipple discharge: Secondary | ICD-10-CM

## 2017-01-22 ENCOUNTER — Other Ambulatory Visit: Payer: Self-pay | Admitting: Surgery

## 2017-01-22 ENCOUNTER — Other Ambulatory Visit (HOSPITAL_COMMUNITY): Payer: Self-pay | Admitting: *Deleted

## 2017-01-22 DIAGNOSIS — D242 Benign neoplasm of left breast: Secondary | ICD-10-CM

## 2017-01-22 DIAGNOSIS — N6452 Nipple discharge: Secondary | ICD-10-CM

## 2017-01-31 ENCOUNTER — Encounter (HOSPITAL_BASED_OUTPATIENT_CLINIC_OR_DEPARTMENT_OTHER): Payer: Self-pay | Admitting: *Deleted

## 2017-01-31 NOTE — Progress Notes (Addendum)
Bring all medications. Coming Tuesday to pick up Ensure before seed. Requested  [place] interpreter from 0830 to 1145 day of surgery.

## 2017-02-01 ENCOUNTER — Encounter (HOSPITAL_COMMUNITY): Payer: Self-pay

## 2017-02-01 ENCOUNTER — Other Ambulatory Visit: Payer: Self-pay

## 2017-02-01 ENCOUNTER — Ambulatory Visit (HOSPITAL_COMMUNITY)
Admission: RE | Admit: 2017-02-01 | Discharge: 2017-02-01 | Disposition: A | Discharge status: Home / Self Care | Payer: Self-pay | Source: Ambulatory Visit | Attending: Obstetrics and Gynecology | Admitting: Obstetrics and Gynecology

## 2017-02-01 VITALS — BP 100/72 | Temp 98.2°F | Ht 65.0 in | Wt 120.0 lb

## 2017-02-01 DIAGNOSIS — Z01419 Encounter for gynecological examination (general) (routine) without abnormal findings: Secondary | ICD-10-CM

## 2017-02-01 DIAGNOSIS — N6325 Unspecified lump in the left breast, overlapping quadrants: Secondary | ICD-10-CM

## 2017-02-01 DIAGNOSIS — N6320 Unspecified lump in the left breast, unspecified quadrant: Secondary | ICD-10-CM

## 2017-02-01 NOTE — Patient Instructions (Signed)
Explained breast self awareness with [name]. Let patient know that if today's Pap smear is normal that her next Pap smear is due in one year due to her history of an abnormal Pap smear 07/18/2014 and only having one normal Pap smear since abnormal. Patient has an appointment at the [facility] on Tuesday, February 06, 2017 at 1330 for placement of radioactive seeds within the left breast and surgery scheduled Wednesday, February 07, 2017 at 1030 for follow-up. Patient aware of appointments and will be there. Let patient know will follow up with her within the next couple weeks with results of Pap smear by phone. Anna Chen verbalized understanding.  Charlita Brian, Arvil Chaco, RN 10:12 AM

## 2017-02-01 NOTE — Progress Notes (Signed)
Patient came to [facility] today to renew her [facility] card.   Pap Smear: Pap smear completed today. Last Pap smear was 10/12/2015 at [facility] and normal with negative HPV. Patient has a history of one abnormal Pap smear 07/18/2014 that was ASCUS with positive HPV. Per patient that is the only abnormal Pap smear she has had. Patients last three Pap smear results are in EPIC.  Physical exam: Breasts Breasts symmetrical. No skin abnormalities right breast. Bruise observed on left breast between 8-10 o'clock from left breast biopsy per patient. No nipple retraction bilateral breasts. Right breast milky discharge when expressed. Sample sent to Cytology 01/27/2016  for right breast discharge that was nondiagnostic material. Unable to express any nipple discharge from the left breast on exam today. No lymphadenopathy. No lumps palpated right breast. Palpated a lump within the left breast at 9 o'clock 1 cm from the nipple. No complaints of pain or tenderness on exam. Patient referred to [facility] for a surgical consultation 01/22/2017. Patient has an appointment at the [facility] on Tuesday, February 06, 2017 at 1330 for placement of radioactive seeds within the left breast and surgery scheduled Wednesday, February 07, 2017 at 1030 for follow-up.   Pelvic/Bimanual   Ext Genitalia No lesions, no swelling and no discharge observed on external genitalia.         Vagina Vagina pink and normal texture. No lesions or discharge observed in vagina.          Cervix Cervix is present. Cervix pink and of normal texture. No discharge observed.     Uterus Uterus is present and palpable. Uterus in normal position and normal size.        Adnexae Bilateral ovaries present and palpable. No tenderness on palpation.          Rectovaginal No rectal exam completed today since patient had no rectal complaints. No skin abnormalities observed on exam.    Smoking History: Patient has never smoked.  Patient  Navigation: Patient education provided. Access to services provided for patient through [place] interpreter provided.    Used [place] interpreter Rosann Auerbach from [employer].

## 2017-02-02 ENCOUNTER — Encounter (HOSPITAL_COMMUNITY): Payer: Self-pay | Admitting: *Deleted

## 2017-02-02 LAB — CYTOLOGY - PAP
DIAGNOSIS: NEGATIVE
HPV (WINDOPATH): NOT DETECTED

## 2017-02-05 ENCOUNTER — Encounter (HOSPITAL_COMMUNITY): Payer: Self-pay | Admitting: *Deleted

## 2017-02-05 NOTE — Progress Notes (Signed)
Letter mailed to patient with negative pap smear results. HPV was negative. Next pap smear due in one year.  

## 2017-02-06 ENCOUNTER — Ambulatory Visit
Admission: RE | Admit: 2017-02-06 | Discharge: 2017-02-06 | Disposition: A | Discharge status: Home / Self Care | Payer: Self-pay | Source: Ambulatory Visit | Attending: Surgery | Admitting: Surgery

## 2017-02-06 DIAGNOSIS — D242 Benign neoplasm of left breast: Secondary | ICD-10-CM

## 2017-02-06 NOTE — Pre-Procedure Instructions (Signed)
Olegario Shearer will be interpreter for pt., per Adria at [facility]; please call [phone] if surgery time changes.

## 2017-02-06 NOTE — H&P (Signed)
Anna Chen is an 36 y.o. female.   Chief Complaint: left breast papilloma  HPI: this patient has a history of bilateral nipple discharge s/p two MRI's and multiple mammograms over the past year.  Most recently, she has a small mass identified which was then biopsied in the left breast showing a papilloma.  Other areas in the past have been negative on biopsy.  She is otherwise healthy and without complaints  Past Medical History:  Diagnosis Date  . Breast discharge    bil for 2 years white spontaneous  . Cough 04/11/2012  . Hemorrhoids, external 03/2012   symptomatic  . Hyperlipidemia    no current med.  . Indigestion    takes OTC as needed    Past Surgical History:  Procedure Laterality Date  . BAND HEMORRHOIDECTOMY  01/2012  . HEMORRHOID SURGERY  04/17/2012   Procedure: HEMORRHOIDECTOMY;  Surgeon: Harl Bowie, MD;  Location: [facility];  Service: General;  Laterality: N/A;  2 column external /internal hemorrhoidectomy    Family History  Problem Relation Age of Onset  . Hypertension Mother   . Hypertension Father   . Thyroid disease Father    Social History:  reports that she has never smoked. She has never used smokeless tobacco. She reports that she does not drink alcohol or use drugs.  Allergies: No Known Allergies  No prescriptions prior to admission.    No results found for this or any previous visit (from the past 48 hour(s)). 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 EXAM: MAMMOGRAPHIC GUIDED RADIOACTIVE SEED LOCALIZATION OF THE LEFT BREAST COMPARISON:  Previous exam(s). FINDINGS: Patient presents for radioactive seed localization prior to left breast excision. I met with the patient and we discussed the procedure of seed localization including benefits and alternatives. We discussed the high likelihood of a successful  procedure. We discussed the risks of the procedure including infection, bleeding, tissue injury and further surgery. We discussed the low dose of radioactivity involved in the procedure. Informed, written consent was given. The usual time-out protocol was performed immediately prior to the procedure. Using mammographic guidance, sterile technique, 1% lidocaine and an I-125 radioactive seed, the recently placed coil shaped biopsy marker clip in the 9 o'clock position of the left breast was localized using a medial approach. The follow-up mammogram images confirm the seed in the expected location and were marked for Dr. Ninfa Linden. Follow-up survey of the patient confirms presence of the radioactive seed. Order number of I-125 seed:  [id number]. Total activity:  4.193 millicurie  Reference Date: 02/05/2017 The patient tolerated the procedure well and was released from the [facility]. She was given instructions regarding seed removal. IMPRESSION: Radioactive seed localization left breast. No apparent complications. Electronically Signed   By: Claudie Revering M.D.   On: 02/06/2017 14:46    Review of Systems  All other systems reviewed and are negative.   Weight 54 kg (119 lb), last menstrual period 01/16/2017. Physical Exam  Constitutional: She is oriented to person, place, and time. She appears well-developed and well-nourished. No distress.  HENT:  Head: Normocephalic and atraumatic.  Right Ear: External ear normal.  Eyes: Pupils are equal, round, and reactive to light. No scleral icterus.  Neck: Normal range of motion. No tracheal deviation present.  Cardiovascular: Normal rate, regular rhythm and normal heart sounds.   Respiratory: Effort normal and breath sounds  normal.  GI: Soft. Bowel sounds are normal. There is no tenderness.  Musculoskeletal: Normal range of motion.  Neurological: She is alert and oriented to person, place, and time.  Skin: Skin is warm and dry.  Psychiatric: Her behavior is  normal. Judgment normal.   Breasts without palpable mass  Assessment/Plan Left breast papilloma  Will proceed to the OR of a radioactive seed guided lumpectomy.  I discussed the risks which include but are not limited to bleeding, infection, continued nipple discharge despite surgery, the need for further studies, etc.  She agrees to proceed.  Harl Bowie, MD 02/06/2017, 10:14 PM

## 2017-02-06 NOTE — Progress Notes (Signed)
Ensure pre surgery drink given with instructions to complete by 0630 dos, pt verbalized understanding. 

## 2017-02-07 ENCOUNTER — Ambulatory Visit (HOSPITAL_BASED_OUTPATIENT_CLINIC_OR_DEPARTMENT_OTHER): Payer: Self-pay | Admitting: Anesthesiology

## 2017-02-07 ENCOUNTER — Encounter (HOSPITAL_BASED_OUTPATIENT_CLINIC_OR_DEPARTMENT_OTHER): Payer: Self-pay | Admitting: *Deleted

## 2017-02-07 ENCOUNTER — Ambulatory Visit (HOSPITAL_BASED_OUTPATIENT_CLINIC_OR_DEPARTMENT_OTHER)
Admission: RE | Admit: 2017-02-07 | Discharge: 2017-02-07 | Disposition: A | Discharge status: Home / Self Care | Payer: Self-pay | Source: Ambulatory Visit | Attending: Surgery | Admitting: Surgery

## 2017-02-07 ENCOUNTER — Ambulatory Visit
Admission: RE | Admit: 2017-02-07 | Discharge: 2017-02-07 | Disposition: A | Discharge status: Home / Self Care | Payer: Self-pay | Source: Ambulatory Visit | Attending: Surgery | Admitting: Surgery

## 2017-02-07 ENCOUNTER — Encounter (HOSPITAL_BASED_OUTPATIENT_CLINIC_OR_DEPARTMENT_OTHER)
Admission: RE | Disposition: A | Discharge status: Home / Self Care | Payer: Self-pay | Source: Ambulatory Visit | Attending: Surgery

## 2017-02-07 DIAGNOSIS — D242 Benign neoplasm of left breast: Secondary | ICD-10-CM | POA: Insufficient documentation

## 2017-02-07 DIAGNOSIS — N6092 Unspecified benign mammary dysplasia of left breast: Secondary | ICD-10-CM | POA: Insufficient documentation

## 2017-02-07 DIAGNOSIS — N6012 Diffuse cystic mastopathy of left breast: Secondary | ICD-10-CM | POA: Insufficient documentation

## 2017-02-07 DIAGNOSIS — N6452 Nipple discharge: Secondary | ICD-10-CM | POA: Insufficient documentation

## 2017-02-07 HISTORY — PX: BREAST LUMPECTOMY: SHX2

## 2017-02-07 HISTORY — PX: BREAST LUMPECTOMY WITH RADIOACTIVE SEED LOCALIZATION: SHX6424

## 2017-02-07 LAB — POCT PREGNANCY, URINE: Preg Test, Ur: NEGATIVE

## 2017-02-07 SURGERY — BREAST LUMPECTOMY WITH RADIOACTIVE SEED LOCALIZATION
Anesthesia: General | Site: Breast | Laterality: Left

## 2017-02-07 MED ORDER — MIDAZOLAM HCL 2 MG/2ML IJ SOLN
INTRAMUSCULAR | Status: AC
  Filled 2017-02-07: qty 2

## 2017-02-07 MED ORDER — ONDANSETRON HCL 4 MG/2ML IJ SOLN
INTRAMUSCULAR | Status: DC | PRN
  Administered 2017-02-07: 4 mg via INTRAVENOUS

## 2017-02-07 MED ORDER — FENTANYL CITRATE (PF) 100 MCG/2ML IJ SOLN
50.0000 ug | INTRAMUSCULAR | Status: DC | PRN
  Administered 2017-02-07: 50 ug via INTRAVENOUS

## 2017-02-07 MED ORDER — MIDAZOLAM HCL 2 MG/2ML IJ SOLN
1.0000 mg | INTRAMUSCULAR | Status: DC | PRN
  Administered 2017-02-07: 2 mg via INTRAVENOUS

## 2017-02-07 MED ORDER — ACETAMINOPHEN 650 MG RE SUPP: 650.0000 mg | RECTAL | Status: DC | PRN

## 2017-02-07 MED ORDER — CEFAZOLIN SODIUM-DEXTROSE 2-4 GM/100ML-% IV SOLN
INTRAVENOUS | Status: AC
  Filled 2017-02-07: qty 100

## 2017-02-07 MED ORDER — BUPIVACAINE-EPINEPHRINE 0.5% -1:200000 IJ SOLN
INTRAMUSCULAR | Status: DC | PRN
  Administered 2017-02-07: 11 mL

## 2017-02-07 MED ORDER — DEXAMETHASONE SODIUM PHOSPHATE 4 MG/ML IJ SOLN
INTRAMUSCULAR | Status: DC | PRN
  Administered 2017-02-07: 10 mg via INTRAVENOUS

## 2017-02-07 MED ORDER — ACETAMINOPHEN 325 MG PO TABS: 650.0000 mg | ORAL_TABLET | ORAL | Status: DC | PRN

## 2017-02-07 MED ORDER — LACTATED RINGERS IV SOLN
INTRAVENOUS | Status: DC
  Administered 2017-02-07: 09:00:00 via INTRAVENOUS

## 2017-02-07 MED ORDER — PROPOFOL 10 MG/ML IV BOLUS
INTRAVENOUS | Status: DC | PRN
  Administered 2017-02-07: 120 mg via INTRAVENOUS

## 2017-02-07 MED ORDER — ACETAMINOPHEN 500 MG PO TABS
ORAL_TABLET | ORAL | Status: AC
  Filled 2017-02-07: qty 2

## 2017-02-07 MED ORDER — GABAPENTIN 300 MG PO CAPS
300.0000 mg | ORAL_CAPSULE | ORAL | Status: AC
  Administered 2017-02-07: 300 mg via ORAL

## 2017-02-07 MED ORDER — SCOPOLAMINE 1 MG/3DAYS TD PT72: 1.0000 | MEDICATED_PATCH | Freq: Once | TRANSDERMAL | Status: DC | PRN

## 2017-02-07 MED ORDER — ONDANSETRON HCL 4 MG/2ML IJ SOLN
INTRAMUSCULAR | Status: AC
  Filled 2017-02-07: qty 2

## 2017-02-07 MED ORDER — CELECOXIB 200 MG PO CAPS
ORAL_CAPSULE | ORAL | Status: AC
  Filled 2017-02-07: qty 1

## 2017-02-07 MED ORDER — DEXAMETHASONE SODIUM PHOSPHATE 10 MG/ML IJ SOLN
INTRAMUSCULAR | Status: AC
  Filled 2017-02-07: qty 1

## 2017-02-07 MED ORDER — SODIUM CHLORIDE 0.9% FLUSH: 3.0000 mL | Freq: Two times a day (BID) | INTRAVENOUS | Status: DC

## 2017-02-07 MED ORDER — GABAPENTIN 300 MG PO CAPS
ORAL_CAPSULE | ORAL | Status: AC
  Filled 2017-02-07: qty 1

## 2017-02-07 MED ORDER — ACETAMINOPHEN 500 MG PO TABS
1000.0000 mg | ORAL_TABLET | ORAL | Status: AC
  Administered 2017-02-07: 1000 mg via ORAL

## 2017-02-07 MED ORDER — CHLORHEXIDINE GLUCONATE CLOTH 2 % EX PADS: 6.0000 | MEDICATED_PAD | Freq: Once | CUTANEOUS | Status: DC

## 2017-02-07 MED ORDER — OXYCODONE HCL 5 MG PO TABS: 5.0000 mg | ORAL_TABLET | Freq: Four times a day (QID) | ORAL | 0 refills | Status: DC | PRN

## 2017-02-07 MED ORDER — LIDOCAINE 2% (20 MG/ML) 5 ML SYRINGE
INTRAMUSCULAR | Status: DC | PRN
  Administered 2017-02-07: 100 mg via INTRAVENOUS

## 2017-02-07 MED ORDER — SODIUM CHLORIDE 0.9 % IV SOLN: 250.0000 mL | INTRAVENOUS | Status: DC | PRN

## 2017-02-07 MED ORDER — MORPHINE SULFATE (PF) 2 MG/ML IV SOLN: 1.0000 mg | INTRAVENOUS | Status: DC | PRN

## 2017-02-07 MED ORDER — PROPOFOL 10 MG/ML IV BOLUS
INTRAVENOUS | Status: AC
  Filled 2017-02-07: qty 20

## 2017-02-07 MED ORDER — OXYCODONE HCL 5 MG PO TABS: 5.0000 mg | ORAL_TABLET | ORAL | Status: DC | PRN

## 2017-02-07 MED ORDER — SODIUM CHLORIDE 0.9% FLUSH: 3.0000 mL | INTRAVENOUS | Status: DC | PRN

## 2017-02-07 MED ORDER — CEFAZOLIN SODIUM-DEXTROSE 2-4 GM/100ML-% IV SOLN
2.0000 g | INTRAVENOUS | Status: AC
  Administered 2017-02-07: 2 g via INTRAVENOUS

## 2017-02-07 MED ORDER — FENTANYL CITRATE (PF) 100 MCG/2ML IJ SOLN
INTRAMUSCULAR | Status: AC
  Filled 2017-02-07: qty 2

## 2017-02-07 MED ORDER — CELECOXIB 200 MG PO CAPS
200.0000 mg | ORAL_CAPSULE | ORAL | Status: AC
  Administered 2017-02-07: 200 mg via ORAL

## 2017-02-07 SURGICAL SUPPLY — 46 items
APPLIER CLIP 9.375 MED OPEN (MISCELLANEOUS) ×3
BLADE HEX COATED 2.75 (ELECTRODE) ×3 IMPLANT
BLADE SURG 15 STRL LF DISP TIS (BLADE) ×1 IMPLANT
BLADE SURG 15 STRL SS (BLADE) ×2
CANISTER SUC SOCK COL 7IN (MISCELLANEOUS) IMPLANT
CANISTER SUCT 1200ML W/VALVE (MISCELLANEOUS) IMPLANT
CHLORAPREP W/TINT 26ML (MISCELLANEOUS) ×3 IMPLANT
CLIP APPLIE 9.375 MED OPEN (MISCELLANEOUS) ×1 IMPLANT
CLIP VESOCCLUDE SM WIDE 6/CT (CLIP) ×3 IMPLANT
COVER BACK TABLE 60X90IN (DRAPES) ×3 IMPLANT
COVER MAYO STAND STRL (DRAPES) ×3 IMPLANT
COVER PROBE W GEL 5X96 (DRAPES) ×3 IMPLANT
DECANTER SPIKE VIAL GLASS SM (MISCELLANEOUS) IMPLANT
DERMABOND ADVANCED (GAUZE/BANDAGES/DRESSINGS) ×2
DERMABOND ADVANCED .7 DNX12 (GAUZE/BANDAGES/DRESSINGS) ×1 IMPLANT
DEVICE DUBIN W/COMP PLATE 8390 (MISCELLANEOUS) ×3 IMPLANT
DRAPE LAPAROSCOPIC ABDOMINAL (DRAPES) ×3 IMPLANT
DRAPE UTILITY XL STRL (DRAPES) ×3 IMPLANT
ELECT REM PT RETURN 9FT ADLT (ELECTROSURGICAL) ×3
ELECTRODE REM PT RTRN 9FT ADLT (ELECTROSURGICAL) ×1 IMPLANT
GAUZE SPONGE 4X4 12PLY STRL LF (GAUZE/BANDAGES/DRESSINGS) IMPLANT
GLOVE BIOGEL PI IND STRL 7.0 (GLOVE) ×1 IMPLANT
GLOVE BIOGEL PI INDICATOR 7.0 (GLOVE) ×2
GLOVE ECLIPSE 6.5 STRL STRAW (GLOVE) ×3 IMPLANT
GLOVE SURG SIGNA 7.5 PF LTX (GLOVE) ×3 IMPLANT
GOWN STRL REUS W/ TWL LRG LVL3 (GOWN DISPOSABLE) ×1 IMPLANT
GOWN STRL REUS W/ TWL XL LVL3 (GOWN DISPOSABLE) ×1 IMPLANT
GOWN STRL REUS W/TWL LRG LVL3 (GOWN DISPOSABLE) ×2
GOWN STRL REUS W/TWL XL LVL3 (GOWN DISPOSABLE) ×2
KIT MARKER MARGIN INK (KITS) ×3 IMPLANT
NEEDLE HYPO 25X1 1.5 SAFETY (NEEDLE) IMPLANT
NS IRRIG 1000ML POUR BTL (IV SOLUTION) IMPLANT
PACK BASIN DAY SURGERY FS (CUSTOM PROCEDURE TRAY) ×3 IMPLANT
PENCIL BUTTON HOLSTER BLD 10FT (ELECTRODE) ×3 IMPLANT
SLEEVE SCD COMPRESS KNEE MED (MISCELLANEOUS) ×3 IMPLANT
SPONGE LAP 4X18 X RAY DECT (DISPOSABLE) ×3 IMPLANT
SUT MNCRL AB 4-0 PS2 18 (SUTURE) ×3 IMPLANT
SUT SILK 2 0 SH (SUTURE) IMPLANT
SUT VIC AB 3-0 SH 27 (SUTURE) ×2
SUT VIC AB 3-0 SH 27X BRD (SUTURE) ×1 IMPLANT
SYR CONTROL 10ML LL (SYRINGE) ×3 IMPLANT
TOWEL OR 17X24 6PK STRL BLUE (TOWEL DISPOSABLE) ×3 IMPLANT
TOWEL OR NON WOVEN STRL DISP B (DISPOSABLE) IMPLANT
TUBE CONNECTING 20'X1/4 (TUBING)
TUBE CONNECTING 20X1/4 (TUBING) IMPLANT
YANKAUER SUCT BULB TIP NO VENT (SUCTIONS) IMPLANT

## 2017-02-07 NOTE — Discharge Instructions (Signed)
[place] Office Phone Number [phone]  BREAST BIOPSY/ PARTIAL MASTECTOMY: POST OP INSTRUCTIONS  Always review your discharge instruction sheet given to you by the facility where your surgery was performed.  IF YOU HAVE DISABILITY OR FAMILY LEAVE FORMS, YOU MUST BRING THEM TO THE OFFICE FOR PROCESSING.  DO NOT GIVE THEM TO YOUR DOCTOR.  1. A prescription for pain medication may be given to you upon discharge.  Take your pain medication as prescribed, if needed.  If narcotic pain medicine is not needed, then you may take acetaminophen (Tylenol) or ibuprofen (Advil) as needed. 2. Take your usually prescribed medications unless otherwise directed 3. If you need a refill on your pain medication, please contact your pharmacy.  They will contact our office to request authorization.  Prescriptions will not be filled after 5pm or on week-ends. 4. You should eat very light the first 24 hours after surgery, such as soup, crackers, pudding, etc.  Resume your normal diet the day after surgery. 5. Most patients will experience some swelling and bruising in the breast.  Ice packs and a good support bra will help.  Swelling and bruising can take several days to resolve.  6. It is common to experience some constipation if taking pain medication after surgery.  Increasing fluid intake and taking a stool softener will usually help or prevent this problem from occurring.  A mild laxative (Milk of Magnesia or Miralax) should be taken according to package directions if there are no bowel movements after 48 hours. 7. Unless discharge instructions indicate otherwise, you may remove your bandages 24-48 hours after surgery, and you may shower at that time.  You may have steri-strips (small skin tapes) in place directly over the incision.  These strips should be left on the skin for 7-10 days.  If your surgeon used skin glue on the incision, you may shower in 24 hours.  The glue will flake off over the  next 2-3 weeks.  Any sutures or staples will be removed at the office during your follow-up visit. 8. ACTIVITIES:  You may resume regular daily activities (gradually increasing) beginning the next day.  Wearing a good support bra or sports bra minimizes pain and swelling.  You may have sexual intercourse when it is comfortable. a. You may drive when you no longer are taking prescription pain medication, you can comfortably wear a seatbelt, and you can safely maneuver your car and apply brakes. b. RETURN TO WORK:  ______________________________________________________________________________________ 9. You should see your doctor in the office for a follow-up appointment approximately two weeks after your surgery.  Your doctors nurse will typically make your follow-up appointment when she calls you with your pathology report.  Expect your pathology report 2-3 business days after your surgery.  You may call to check if you do not hear from [place] after three days. 10. OTHER INSTRUCTIONS: __OK TO SHOWER STARTING TOMORROW 11. ICE PACK, IBUPROFEN, TYLENOL ALSO FOR PAIN 12. NO VIGOROUS ACTIVITY FOR ONE WEEK_____________________________________________________________________________________________ _____________________________________________________________________________________________________________________________________ _____________________________________________________________________________________________________________________________________ _____________________________________________________________________________________________________________________________________  WHEN TO CALL YOUR DOCTOR: 1. Fever over 101.0 2. Nausea and/or vomiting. 3. Extreme swelling or bruising. 4. Continued bleeding from incision. 5. Increased pain, redness, or drainage from the incision.  The clinic staff is available to answer your questions during regular business hours.  Please dont hesitate to call  and ask to speak to one of the nurses for clinical concerns.  If you have a medical emergency, go to the nearest emergency room or call 911.  A surgeon  from [facility] is always on call at the hospital.  For further questions, please visit [email or web address]      Post Anesthesia Home Care Instructions  Activity: Get plenty of rest for the remainder of the day. A responsible individual must stay with you for 24 hours following the procedure.  For the next 24 hours, DO NOT: -Drive a car -Paediatric nurse -Drink alcoholic beverages -Take any medication unless instructed by your physician -Make any legal decisions or sign important papers.  Meals: Start with liquid foods such as gelatin or soup. Progress to regular foods as tolerated. Avoid greasy, spicy, heavy foods. If nausea and/or vomiting occur, drink only clear liquids until the nausea and/or vomiting subsides. Call your physician if vomiting continues.  Special Instructions/Symptoms: Your throat may feel dry or sore from the anesthesia or the breathing tube placed in your throat during surgery. If this causes discomfort, gargle with warm salt water. The discomfort should disappear within 24 hours.  If you had a scopolamine patch placed behind your ear for the management of post- operative nausea and/or vomiting:  1. The medication in the patch is effective for 72 hours, after which it should be removed.  Wrap patch in a tissue and discard in the trash. Wash hands thoroughly with soap and water. 2. You may remove the patch earlier than 72 hours if you experience unpleasant side effects which may include dry mouth, dizziness or visual disturbances. 3. Avoid touching the patch. Wash your hands with soap and water after contact with the patch.

## 2017-02-07 NOTE — Transfer of Care (Signed)
Immediate Anesthesia Transfer of Care Note  Patient: Anna Chen  Procedure(s) Performed: LEFT BREAST LUMPECTOMY WITH RADIOACTIVE SEED LOCALIZATION (Left Breast)  Patient Location: PACU  Anesthesia Type:General  Level of Consciousness: sedated  Airway & Oxygen Therapy: Patient Spontanous Breathing and Patient connected to face mask oxygen  Post-op Assessment: Report given to RN and Post -op Vital signs reviewed and stable  Post vital signs: Reviewed and stable  Last Vitals:  Vitals:   02/07/17 1050 02/07/17 1051  BP: (!) 84/52   Pulse: (!) 56 (!) 58  Resp: 11 10  Temp:    SpO2: 100% 100%    Last Pain:  Vitals:   02/07/17 0908  TempSrc: Oral         Complications: No apparent anesthesia complications

## 2017-02-07 NOTE — Op Note (Signed)
LEFT BREAST LUMPECTOMY WITH RADIOACTIVE SEED LOCALIZATION  Procedure Note  Mehek Grega 02/07/2017   Pre-op Diagnosis: LEFT BREAST PAPILLOMA     Post-op Diagnosis: same  Procedure(s): LEFT BREAST LUMPECTOMY WITH RADIOACTIVE SEED LOCALIZATION  Surgeon(s): Coralie Keens, MD  Anesthesia: General  Staff:  Circulator: Faythe Dingwall, RN Scrub Person: Theressa Stamps, CST  Estimated Blood Loss: Minimal               Specimens: sent to path  Indications: This is a 36 year old female with bilateral nipple discharge. She has had an extensive workup including 2 separate MRIs and multiple stereotactic biopsies. The last one has shown a left breast papilloma adjacent to the areola of the left breast. Lumpectomy has been recommended  Procedure: The patient was brought to the operating room and identified as the correct patient. She was placed supine on the operating table and general anesthesia was induced. Her left breast was prepped and draped in the usual sterile fashion. I anesthetized the skin around the medial edge of the areola with Marcaine. I made an incision with a scalpel. I took this down to the breast tissue with the electrocautery. I then dissected underneath the nipple areolar complex and with the aid of the neoprobe performed a lumpectomy staying widely around the radioactive seed. There were several dilated ducts draining clear fluid as well as a thickened substance. Once the lumpectomy specimen was completely excised, I confirmed that the radioactive seed was in the specimen with the neoprobe. The margins were marked with paint. X-ray was performed confirming that the radioactive seed and previous biopsy clip were in the specimen.  The specimen was then sent to pathology for evaluation. I placed 2 surgical clips in the biopsy cavity. I achieved hemostasis with the cautery. I anesthetized wound further with Marcaine. I then closed the incision with several interrupted 3-0 Vicryl  sutures and a running 4-0 Monocryl suture. Dermabond was then applied. Patient tolerated procedure well. All counts were correct at the end of the procedure. The patient was then extubated in the operating room and taken in a stable condition to the recovery room.          Loyd Salvador A   Date: 02/07/2017  Time: 10:45 AM

## 2017-02-07 NOTE — Anesthesia Procedure Notes (Signed)
Procedure Name: LMA Insertion Date/Time: 02/07/2017 10:08 AM Performed by: Maryella Shivers Pre-anesthesia Checklist: Patient identified, Emergency Drugs available, Suction available and Patient being monitored Patient Re-evaluated:Patient Re-evaluated prior to induction Oxygen Delivery Method: Circle system utilized Preoxygenation: Pre-oxygenation with 100% oxygen Induction Type: IV induction Ventilation: Mask ventilation without difficulty LMA: LMA inserted LMA Size: 3.0 Number of attempts: 1 Airway Equipment and Method: Bite block Placement Confirmation: positive ETCO2 Tube secured with: Tape Dental Injury: Teeth and Oropharynx as per pre-operative assessment

## 2017-02-07 NOTE — Interval H&P Note (Signed)
History and Physical Interval Note:no change in H and P  02/07/2017 9:33 AM  [name]  has presented today for surgery, with the diagnosis of LEFT BREAST PAPILLOMA  The various methods of treatment have been discussed with the patient and family. After consideration of risks, benefits and other options for treatment, the patient has consented to  Procedure(s): LEFT BREAST LUMPECTOMY WITH RADIOACTIVE SEED LOCALIZATION (Left) as a surgical intervention .  The patient's history has been reviewed, patient examined, no change in status, stable for surgery.  I have reviewed the patient's chart and labs.  Questions were answered to the patient's satisfaction.     Bassam Dresch A

## 2017-02-07 NOTE — Anesthesia Preprocedure Evaluation (Addendum)
Anesthesia Evaluation  Patient identified by MRN, date of birth, ID band Patient awake    Reviewed: Allergy & Precautions, H&P , Patient's Chart, lab work & pertinent test results, reviewed documented beta blocker date and time   Airway Mallampati: II  TM Distance: >3 FB Neck ROM: full    Dental no notable dental hx.    Pulmonary    Pulmonary exam normal breath sounds clear to auscultation       Cardiovascular  Rhythm:regular Rate:Normal     Neuro/Psych    GI/Hepatic   Endo/Other    Renal/GU      Musculoskeletal   Abdominal   Peds  Hematology   Anesthesia Other Findings   Reproductive/Obstetrics                             Anesthesia Physical Anesthesia Plan  ASA: II  Anesthesia Plan: General   Post-op Pain Management:    Induction: Intravenous  PONV Risk Score and Plan: 2 and Ondansetron and Dexamethasone  Airway Management Planned: LMA  Additional Equipment:   Intra-op Plan:   Post-operative Plan:   Informed Consent: I have reviewed the patients History and Physical, chart, labs and discussed the procedure including the risks, benefits and alternatives for the proposed anesthesia with the patient or authorized representative who has indicated his/her understanding and acceptance.   Dental Advisory Given  Plan Discussed with: CRNA and Surgeon  Anesthesia Plan Comments: ( )        Anesthesia Quick Evaluation

## 2017-02-07 NOTE — Anesthesia Postprocedure Evaluation (Signed)
Anesthesia Post Note  Patient: Anna Chen  Procedure(s) Performed: LEFT BREAST LUMPECTOMY WITH RADIOACTIVE SEED LOCALIZATION (Left Breast)     Patient location during evaluation: PACU Anesthesia Type: General Level of consciousness: awake and alert Pain management: pain level controlled Vital Signs Assessment: post-procedure vital signs reviewed and stable Respiratory status: spontaneous breathing, nonlabored ventilation, respiratory function stable and patient connected to nasal cannula oxygen Cardiovascular status: blood pressure returned to baseline and stable Postop Assessment: no apparent nausea or vomiting Anesthetic complications: no    Last Vitals:  Vitals:   02/07/17 1130 02/07/17 1224  BP: 112/72 106/64  Pulse: (!) 51 (!) 53  Resp: 11 18  Temp:  (!) 36.4 C  SpO2: 100% 100%    Last Pain:  Vitals:   02/07/17 1224  TempSrc:   PainSc: 0-No pain                 Juwann Sherk EDWARD

## 2017-02-08 ENCOUNTER — Encounter (HOSPITAL_BASED_OUTPATIENT_CLINIC_OR_DEPARTMENT_OTHER): Payer: Self-pay | Admitting: Surgery

## 2017-04-03 NOTE — Patient Instructions (Signed)
Your procedure is scheduled on:  Wednesday, Dec. 12, 2018  Enter through the [employer] [facility] at:  2:00 PM  Pick up the phone at the desk and dial [phone].  Call this number if you have problems the morning of surgery: [phone].  Remember: Do NOT eat food:  After Midnight Tuesday  Do NOT drink clear liquids after:  9:00 AM Wednesday  Take these medicines the morning of surgery with a SIP OF WATER:  None  Stop ALL herbal medications at this time  Do NOT smoke the day of surgery.  Do NOT wear jewelry (body piercing), metal hair clips/bobby pins, make-up, artifical eyelashes or nail polish. Do NOT wear lotions, powders, or perfumes.  You may wear deodorant. Do NOT shave for 48 hours prior to surgery. Do NOT bring valuables to the hospital. Contacts, dentures, or bridgework may not be worn into surgery.  Have a responsible adult drive you home and stay with you for 24 hours after your procedure  Bring a copy of your healthcare power of attorney and living will documents.  [facility] - Preparing for Surgery Before surgery, you can play an important role.  Because skin is not sterile, your skin needs to be as free of germs as possible.  You can reduce the number of germs on your skin by washing with CHG (chlorahexidine gluconate) soap before surgery.  CHG is an antiseptic cleaner which kills germs and bonds with the skin to continue killing germs even after washing. Please DO NOT use if you have an allergy to CHG or antibacterial soaps.  If your skin becomes reddened/irritated stop using the CHG and inform your nurse when you arrive at Short Stay. Do not shave (including legs and underarms) for at least 48 hours prior to the first CHG shower.  You may shave your face/neck.  Please follow these instructions carefully:  1.  Shower with CHG Soap the night before surgery and the  morning of surgery.  2.  If you choose to wash your hair, wash your hair first as usual  with your normal  shampoo.  3.  After you shampoo, rinse your hair and body thoroughly to remove the shampoo.                             4.  Use CHG as you would any other liquid soap.  You can apply chg directly to the skin and wash.  Gently with a scrungie or clean washcloth.  5.  Apply the CHG Soap to your body ONLY FROM THE NECK DOWN.   Do   not use on face/ open                           Wound or open sores. Avoid contact with eyes, ears mouth and   genitals (private parts).                       Wash face,  Genitals (private parts) with your normal soap.             6.  Wash thoroughly, paying special attention to the area where your    surgery  will be performed.  7.  Thoroughly rinse your body with warm water from the neck down.  8.  DO NOT shower/wash with your normal soap after using and rinsing off the CHG Soap.  9.  Pat yourself dry with a clean towel.            10.  Wear clean pajamas.            11.  Place clean sheets on your bed the night of your first shower and do not  sleep with pets. Day of Surgery : Do not apply any lotions/deodorants the morning of surgery.  Please wear clean clothes to the hospital/surgery center.  FAILURE TO FOLLOW THESE INSTRUCTIONS MAY RESULT IN THE CANCELLATION OF YOUR SURGERY  PATIENT SIGNATURE_________________________________  NURSE SIGNATURE__________________________________  ________________________________________________________________________

## 2017-04-05 ENCOUNTER — Encounter (HOSPITAL_COMMUNITY)
Admission: RE | Admit: 2017-04-05 | Discharge: 2017-04-05 | Disposition: A | Discharge status: Home / Self Care | Payer: Self-pay | Source: Ambulatory Visit | Attending: Obstetrics and Gynecology | Admitting: Obstetrics and Gynecology

## 2017-04-05 ENCOUNTER — Encounter (HOSPITAL_COMMUNITY): Payer: Self-pay

## 2017-04-05 ENCOUNTER — Other Ambulatory Visit: Payer: Self-pay | Admitting: Obstetrics and Gynecology

## 2017-04-05 ENCOUNTER — Other Ambulatory Visit: Payer: Self-pay

## 2017-04-05 DIAGNOSIS — Z01812 Encounter for preprocedural laboratory examination: Secondary | ICD-10-CM | POA: Insufficient documentation

## 2017-04-05 DIAGNOSIS — N9489 Other specified conditions associated with female genital organs and menstrual cycle: Secondary | ICD-10-CM | POA: Insufficient documentation

## 2017-04-05 LAB — CBC
HEMATOCRIT: 39 % (ref 36.0–46.0)
HEMOGLOBIN: 12.6 g/dL (ref 12.0–15.0)
MCH: 31.4 pg (ref 26.0–34.0)
MCHC: 32.3 g/dL (ref 30.0–36.0)
MCV: 97.3 fL (ref 78.0–100.0)
Platelets: 221 10*3/uL (ref 150–400)
RBC: 4.01 MIL/uL (ref 3.87–5.11)
RDW: 12.8 % (ref 11.5–15.5)
WBC: 5.7 10*3/uL (ref 4.0–10.5)

## 2017-04-05 NOTE — Pre-Procedure Instructions (Signed)
Tresa Endo Language resource interpreter was available to complete pre op appointment today.

## 2017-04-10 ENCOUNTER — Other Ambulatory Visit: Payer: Self-pay | Admitting: Obstetrics and Gynecology
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
## 2017-04-11 ENCOUNTER — Encounter (HOSPITAL_COMMUNITY)
Admission: AD | Disposition: A | Discharge status: Home / Self Care | Payer: Self-pay | Source: Ambulatory Visit | Attending: Obstetrics and Gynecology

## 2017-04-11 ENCOUNTER — Other Ambulatory Visit: Payer: Self-pay

## 2017-04-11 ENCOUNTER — Ambulatory Visit (HOSPITAL_COMMUNITY)
Admission: AD | Admit: 2017-04-11 | Discharge: 2017-04-11 | Disposition: A | Discharge status: Home / Self Care | Payer: Self-pay | Source: Ambulatory Visit | Attending: Obstetrics and Gynecology | Admitting: Obstetrics and Gynecology

## 2017-04-11 ENCOUNTER — Inpatient Hospital Stay (HOSPITAL_COMMUNITY): Payer: Self-pay | Admitting: Anesthesiology

## 2017-04-11 ENCOUNTER — Encounter (HOSPITAL_COMMUNITY): Payer: Self-pay | Admitting: Registered Nurse

## 2017-04-11 DIAGNOSIS — N760 Acute vaginitis: Secondary | ICD-10-CM | POA: Insufficient documentation

## 2017-04-11 DIAGNOSIS — N840 Polyp of corpus uteri: Secondary | ICD-10-CM | POA: Diagnosis present

## 2017-04-11 DIAGNOSIS — N9489 Other specified conditions associated with female genital organs and menstrual cycle: Secondary | ICD-10-CM | POA: Diagnosis present

## 2017-04-11 DIAGNOSIS — N939 Abnormal uterine and vaginal bleeding, unspecified: Secondary | ICD-10-CM | POA: Diagnosis present

## 2017-04-11 HISTORY — PX: DILATATION & CURETTAGE/HYSTEROSCOPY WITH MYOSURE: SHX6511

## 2017-04-11 LAB — CBC
HEMATOCRIT: 41 % (ref 36.0–46.0)
Hemoglobin: 13.3 g/dL (ref 12.0–15.0)
MCH: 31.2 pg (ref 26.0–34.0)
MCHC: 32.4 g/dL (ref 30.0–36.0)
MCV: 96.2 fL (ref 78.0–100.0)
PLATELETS: 211 10*3/uL (ref 150–400)
RBC: 4.26 MIL/uL (ref 3.87–5.11)
RDW: 12.7 % (ref 11.5–15.5)
WBC: 6.3 10*3/uL (ref 4.0–10.5)

## 2017-04-11 LAB — PREGNANCY, URINE: Preg Test, Ur: NEGATIVE

## 2017-04-11 LAB — GLUCOSE, CAPILLARY: GLUCOSE-CAPILLARY: 65 mg/dL (ref 65–99)

## 2017-04-11 SURGERY — DILATATION & CURETTAGE/HYSTEROSCOPY WITH MYOSURE
Anesthesia: General | Site: Vagina

## 2017-04-11 MED ORDER — CEFAZOLIN SODIUM-DEXTROSE 2-4 GM/100ML-% IV SOLN: 2.0000 g | INTRAVENOUS | Status: DC

## 2017-04-11 MED ORDER — FENTANYL CITRATE (PF) 100 MCG/2ML IJ SOLN: 25.0000 ug | INTRAMUSCULAR | Status: DC | PRN

## 2017-04-11 MED ORDER — LACTATED RINGERS IV SOLN
INTRAVENOUS | Status: DC
  Administered 2017-04-11 (×2): via INTRAVENOUS

## 2017-04-11 MED ORDER — ONDANSETRON HCL 4 MG/2ML IJ SOLN
INTRAMUSCULAR | Status: AC
  Filled 2017-04-11: qty 2

## 2017-04-11 MED ORDER — LIDOCAINE 2% (20 MG/ML) 5 ML SYRINGE
INTRAMUSCULAR | Status: DC | PRN
  Administered 2017-04-11: 40 mg via INTRAVENOUS

## 2017-04-11 MED ORDER — DEXAMETHASONE SODIUM PHOSPHATE 10 MG/ML IJ SOLN
INTRAMUSCULAR | Status: AC
  Filled 2017-04-11: qty 1

## 2017-04-11 MED ORDER — SOD CITRATE-CITRIC ACID 500-334 MG/5ML PO SOLN
30.0000 mL | Freq: Once | ORAL | Status: AC
  Administered 2017-04-11: 30 mL via ORAL
  Filled 2017-04-11: qty 15

## 2017-04-11 MED ORDER — KETOROLAC TROMETHAMINE 30 MG/ML IJ SOLN
INTRAMUSCULAR | Status: DC | PRN
  Administered 2017-04-11: 30 mg via INTRAVENOUS

## 2017-04-11 MED ORDER — PROPOFOL 10 MG/ML IV BOLUS
INTRAVENOUS | Status: DC | PRN
  Administered 2017-04-11: 200 mg via INTRAVENOUS

## 2017-04-11 MED ORDER — KETOROLAC TROMETHAMINE 30 MG/ML IJ SOLN
INTRAMUSCULAR | Status: AC
  Filled 2017-04-11: qty 1

## 2017-04-11 MED ORDER — MIDAZOLAM HCL 2 MG/2ML IJ SOLN
INTRAMUSCULAR | Status: AC
  Filled 2017-04-11: qty 2

## 2017-04-11 MED ORDER — LIDOCAINE HCL (CARDIAC) 20 MG/ML IV SOLN
INTRAVENOUS | Status: AC
  Filled 2017-04-11: qty 5

## 2017-04-11 MED ORDER — FAMOTIDINE IN NACL 20-0.9 MG/50ML-% IV SOLN
20.0000 mg | Freq: Once | INTRAVENOUS | Status: AC
  Administered 2017-04-11: 20 mg via INTRAVENOUS
  Filled 2017-04-11: qty 50

## 2017-04-11 MED ORDER — DEXAMETHASONE SODIUM PHOSPHATE 10 MG/ML IJ SOLN
INTRAMUSCULAR | Status: DC | PRN
  Administered 2017-04-11: 10 mg via INTRAVENOUS

## 2017-04-11 MED ORDER — FENTANYL CITRATE (PF) 100 MCG/2ML IJ SOLN
INTRAMUSCULAR | Status: DC | PRN
  Administered 2017-04-11 (×2): 50 ug via INTRAVENOUS

## 2017-04-11 MED ORDER — SCOPOLAMINE 1 MG/3DAYS TD PT72
MEDICATED_PATCH | TRANSDERMAL | Status: AC
  Filled 2017-04-11: qty 1

## 2017-04-11 MED ORDER — MIDAZOLAM HCL 2 MG/2ML IJ SOLN: 0.5000 mg | Freq: Once | INTRAMUSCULAR | Status: DC | PRN

## 2017-04-11 MED ORDER — ONDANSETRON HCL 4 MG/2ML IJ SOLN
INTRAMUSCULAR | Status: DC | PRN
  Administered 2017-04-11: 4 mg via INTRAVENOUS

## 2017-04-11 MED ORDER — PROPOFOL 10 MG/ML IV BOLUS
INTRAVENOUS | Status: AC
  Filled 2017-04-11: qty 20

## 2017-04-11 MED ORDER — MEPERIDINE HCL 25 MG/ML IJ SOLN: 6.2500 mg | INTRAMUSCULAR | Status: DC | PRN

## 2017-04-11 MED ORDER — IBUPROFEN 800 MG PO TABS
800.0000 mg | ORAL_TABLET | Freq: Three times a day (TID) | ORAL | 0 refills | Status: DC | PRN
Start: 2017-04-11 — End: 2024-01-28

## 2017-04-11 MED ORDER — BUPIVACAINE HCL (PF) 0.25 % IJ SOLN
INTRAMUSCULAR | Status: DC | PRN
  Administered 2017-04-11: 18 mL

## 2017-04-11 MED ORDER — MIDAZOLAM HCL 5 MG/5ML IJ SOLN
INTRAMUSCULAR | Status: DC | PRN
  Administered 2017-04-11: 2 mg via INTRAVENOUS

## 2017-04-11 MED ORDER — FENTANYL CITRATE (PF) 100 MCG/2ML IJ SOLN
INTRAMUSCULAR | Status: AC
  Filled 2017-04-11: qty 2

## 2017-04-11 MED ORDER — BUPIVACAINE HCL (PF) 0.25 % IJ SOLN
INTRAMUSCULAR | Status: AC
  Filled 2017-04-11: qty 30

## 2017-04-11 MED ORDER — PROMETHAZINE HCL 25 MG/ML IJ SOLN: 6.2500 mg | INTRAMUSCULAR | Status: DC | PRN

## 2017-04-11 MED ORDER — SCOPOLAMINE 1 MG/3DAYS TD PT72
1.0000 | MEDICATED_PATCH | Freq: Once | TRANSDERMAL | Status: AC
  Administered 2017-04-11: 1 via TRANSDERMAL

## 2017-04-11 MED ORDER — HYDROCODONE-ACETAMINOPHEN 5-325 MG PO TABS: 1.0000 | ORAL_TABLET | Freq: Four times a day (QID) | ORAL | 0 refills | Status: DC | PRN

## 2017-04-11 SURGICAL SUPPLY — 18 items
CANISTER SUCT 3000ML PPV (MISCELLANEOUS) ×3 IMPLANT
CATH ROBINSON RED A/P 16FR (CATHETERS) ×3 IMPLANT
CONTAINER PREFILL 10% NBF 60ML (FORM) ×6 IMPLANT
DEVICE MYOSURE LITE (MISCELLANEOUS) IMPLANT
DEVICE MYOSURE REACH (MISCELLANEOUS) IMPLANT
FILTER ARTHROSCOPY CONVERTOR (FILTER) ×3 IMPLANT
GLOVE BIOGEL M 6.5 STRL (GLOVE) ×6 IMPLANT
GLOVE BIOGEL PI IND STRL 6.5 (GLOVE) ×1 IMPLANT
GLOVE BIOGEL PI IND STRL 7.0 (GLOVE) ×1 IMPLANT
GLOVE BIOGEL PI INDICATOR 6.5 (GLOVE) ×2
GLOVE BIOGEL PI INDICATOR 7.0 (GLOVE) ×2
GOWN STRL REUS W/TWL LRG LVL3 (GOWN DISPOSABLE) ×6 IMPLANT
PACK VAGINAL MINOR WOMEN LF (CUSTOM PROCEDURE TRAY) ×3 IMPLANT
PAD OB MATERNITY 4.3X12.25 (PERSONAL CARE ITEMS) ×3 IMPLANT
SEAL ROD LENS SCOPE MYOSURE (ABLATOR) ×3 IMPLANT
TOWEL OR 17X24 6PK STRL BLUE (TOWEL DISPOSABLE) ×6 IMPLANT
TUBING AQUILEX INFLOW (TUBING) ×3 IMPLANT
TUBING AQUILEX OUTFLOW (TUBING) ×3 IMPLANT

## 2017-04-11 NOTE — OR Nursing (Signed)
Pt. Speaks Mandarin. The [employer]AT[employer] video translator was used by Marla Roe, CRNA, Dr. Annye Asa, anesthesia and Gar Ponto, RN to assess and interview patient preoperatively. Pt stated she  understood all information provided.

## 2017-04-11 NOTE — Anesthesia Postprocedure Evaluation (Signed)
Anesthesia Post Note  Patient: Anna Chen  Procedure(s) Performed: DILATATION & CURETTAGE/HYSTEROSCOPY WITH MYOSURE POLYPECTOMY (N/A Vagina )     Patient location during evaluation: PACU Anesthesia Type: General Level of consciousness: awake and alert, patient cooperative and oriented Pain management: pain level controlled Vital Signs Assessment: post-procedure vital signs reviewed and stable Respiratory status: spontaneous breathing, nonlabored ventilation and respiratory function stable Cardiovascular status: blood pressure returned to baseline and stable Postop Assessment: no apparent nausea or vomiting Anesthetic complications: no    Last Vitals:  Vitals:   04/11/17 1745 04/11/17 1800  BP: 120/76 122/79  Pulse: (!) 58 (!) 49  Resp: 10 12  Temp:  36.6 C  SpO2: 100% 100%    Last Pain:  Vitals:   04/11/17 1800  TempSrc:   PainSc: 0-No pain   Pain Goal: Patients Stated Pain Goal: 3 (04/11/17 1800)               Seleta Rhymes. Alfreddie Consalvo

## 2017-04-11 NOTE — Op Note (Addendum)
04/11/2017  5:27 PM  PATIENT:  Anna Chen  36 y.o. female  PRE-OPERATIVE DIAGNOSIS:  N94.69 Endometrial Mass  POST-OPERATIVE DIAGNOSIS:  endometrial mass   PROCEDURE:  Procedure(s) with comments: DILATATION & CURETTAGE/HYSTEROSCOPY WITH MYOSURE POLYPECTOMY (N/A) - Possible Polypectomy  SURGEON:  Surgeon(s) and Role:    Christophe Louis, MD - Primary  PHYSICIAN ASSISTANT:   ASSISTANTS: none   ANESTHESIA:   general  EBL:  20 mL   BLOOD ADMINISTERED:none  DRAINS: none   LOCAL MEDICATIONS USED:  MARCAINE     SPECIMEN:  Source of Specimen:  endometrial polyp and currettings   DISPOSITION OF SPECIMEN:  PATHOLOGY  COUNTS:  YES  TOURNIQUET:  * No tourniquets in log *  DICTATION: .Dragon Dictation  PLAN OF CARE: Discharge to home after PACU  PATIENT DISPOSITION:  PACU - hemodynamically stable.   Delay start of Pharmacological VTE agent (>24hrs) due to surgical blood loss or risk of bleeding: not applicable  Finding.. Normal external genitalia. Normal vagina and cervix... Polypoid endometrium... Dominant polyp located fundally.    Procedure: Patient was taken to the operating room #7  where she was placed under general anesthesia. Time out was performed.  She was placed in the dorsal lithotomy position. She was prepped and draped in the usual sterile fashion. A speculum was placed into the vaginal vault. The anterior lip of the cervix was grasped with a single-tooth tenaculum. Quarter percent Marcaine was injected at the 4 and 8:00 positions of the cervix. The cervix was then sounded to 9 cm. The cervix was dilated to approximately 8 mm. myosure operative  hysteroscope was inserted. The findings noted above. The myosure reach  polyp blade was introduced through the truclear hysteroscopy. The endometrial polyp was removed without difficulty.   There was no evidence of perforation. Hysteroscope was then removed. The single-tooth tenaculum was removed from the anterior lip of the  cervix. Patient was noted to have bleeding from the tenaculum site. Silver nitrate was applied and excellent hemostasis was noted. The speculum was removed from the patient's vagina. She was awakened from anesthesia taken care  To the recovery  room awake and in stable condition. Sponge lap and needle counts were correct x2.  Saline deficit 250 cc.

## 2017-04-11 NOTE — Transfer of Care (Signed)
Immediate Anesthesia Transfer of Care Note  Patient: Anna Chen  Procedure(s) Performed: DILATATION & CURETTAGE/HYSTEROSCOPY WITH MYOSURE POLYPECTOMY (N/A Vagina )  Patient Location: PACU  Anesthesia Type:General  Level of Consciousness: sedated  Airway & Oxygen Therapy: Patient Spontanous Breathing and Patient connected to nasal cannula oxygen  Post-op Assessment: Report given to RN and Post -op Vital signs reviewed and stable  Post vital signs: Reviewed and stable  Last Vitals:  Vitals:   04/11/17 1523  BP: (!) 113/58  Pulse: 60  Resp: 20  Temp: 36.7 C  SpO2: 100%    Last Pain:  Vitals:   04/11/17 1526  TempSrc:   PainSc: 0-No pain         Complications: No apparent anesthesia complications

## 2017-04-11 NOTE — Progress Notes (Signed)
I am able to communicate with patient in PACU.  She speaks [place].  Primary language is Mandarini, may use [employer] remote video for discharge instructions.  Will evaluate when patient is in phase 2.

## 2017-04-11 NOTE — H&P (Signed)
Date of Initial H&P: 04/10/2017 History reviewed, patient examined, no change in status, stable for surgery.

## 2017-04-11 NOTE — Anesthesia Preprocedure Evaluation (Addendum)
Anesthesia Evaluation  Patient identified by MRN, date of birth, ID band Patient awake    Reviewed: Allergy & Precautions, NPO status , Patient's Chart, lab work & pertinent test results  History of Anesthesia Complications Negative for: history of anesthetic complications  Airway Mallampati: I  TM Distance: >3 FB Neck ROM: Full    Dental  (+) Dental Advisory Given   Pulmonary neg pulmonary ROS,    breath sounds clear to auscultation       Cardiovascular negative cardio ROS   Rhythm:Regular Rate:Normal     Neuro/Psych negative neurological ROS     GI/Hepatic negative GI ROS, Neg liver ROS,   Endo/Other  negative endocrine ROS  Renal/GU negative Renal ROS     Musculoskeletal   Abdominal   Peds  Hematology negative hematology ROS (+)   Anesthesia Other Findings   Reproductive/Obstetrics                            Anesthesia Physical Anesthesia Plan  ASA: I  Anesthesia Plan: General   Post-op Pain Management:    Induction: Intravenous  PONV Risk Score and Plan: 3 and Ondansetron, Dexamethasone, Midazolam and Scopolamine patch - Pre-op  Airway Management Planned: LMA  Additional Equipment:   Intra-op Plan:   Post-operative Plan:   Informed Consent: I have reviewed the patients History and Physical, chart, labs and discussed the procedure including the risks, benefits and alternatives for the proposed anesthesia with the patient or authorized representative who has indicated his/her understanding and acceptance.   Dental advisory given  Plan Discussed with: CRNA and Surgeon  Anesthesia Plan Comments: (Plan routine monitors, GA- LMA OK)        Anesthesia Quick Evaluation

## 2017-04-11 NOTE — Progress Notes (Signed)
Used [employer] Video Interpreting Geni Bers [phone] for reviewing discharge instructions with patient in PACU, Phase 2.  All questions answered, written copy given to patient.  Patient call niece for ride home.

## 2017-04-11 NOTE — MAU Note (Signed)
Urine in lab 

## 2017-04-11 NOTE — Discharge Instructions (Signed)

## 2017-04-11 NOTE — Anesthesia Procedure Notes (Signed)
Procedure Name: LMA Insertion Date/Time: 04/11/2017 4:39 PM Performed by: Talbot Grumbling, CRNA Pre-anesthesia Checklist: Patient identified, Emergency Drugs available, Suction available and Patient being monitored Patient Re-evaluated:Patient Re-evaluated prior to induction Oxygen Delivery Method: Circle system utilized Preoxygenation: Pre-oxygenation with 100% oxygen Induction Type: IV induction Ventilation: Mask ventilation without difficulty LMA: LMA inserted LMA Size: 4.0 Number of attempts: 1 Placement Confirmation: positive ETCO2 and breath sounds checked- equal and bilateral Tube secured with: Tape Dental Injury: Teeth and Oropharynx as per pre-operative assessment

## 2017-04-12 ENCOUNTER — Encounter (HOSPITAL_COMMUNITY): Payer: Self-pay | Admitting: Obstetrics and Gynecology

## 2017-04-13 ENCOUNTER — Encounter: Payer: Self-pay | Admitting: Family Medicine

## 2017-08-28 ENCOUNTER — Other Ambulatory Visit: Payer: Self-pay | Admitting: Obstetrics and Gynecology

## 2017-08-28 ENCOUNTER — Other Ambulatory Visit: Payer: Self-pay | Admitting: Surgery

## 2017-08-28 DIAGNOSIS — N6452 Nipple discharge: Secondary | ICD-10-CM

## 2017-09-03 ENCOUNTER — Ambulatory Visit: Payer: Self-pay

## 2017-09-03 ENCOUNTER — Ambulatory Visit
Admission: RE | Admit: 2017-09-03 | Discharge: 2017-09-03 | Disposition: A | Discharge status: Home / Self Care | Payer: No Typology Code available for payment source | Source: Ambulatory Visit | Attending: Obstetrics and Gynecology | Admitting: Obstetrics and Gynecology

## 2017-09-03 DIAGNOSIS — N6452 Nipple discharge: Secondary | ICD-10-CM

## 2018-02-27 ENCOUNTER — Telehealth (HOSPITAL_COMMUNITY): Payer: Self-pay | Admitting: *Deleted

## 2018-02-27 NOTE — Telephone Encounter (Signed)
Patient called me and left voicemail. Called patient back. Patient's [facility] card expired 02/01/2018. [facility] wanted to renew her [facility] card. Patients last diagnostic mammogram was 09/03/2017 Negative and a bilateral screening mammogram is recommended in one year. Told patient she is not due for a mammogram until after 09/04/2018 based on the result note. Asked patient if she has any new breast problems. Patient complained of the same problems that was followed and stated no changes since last mammogram. Told patient she can give [place] a call back in a couple months and we will get her scheduled for May. Informed patient that if she develops a new breast problem to give [place] a call and we can get her in sooner. Patient verbalized understanding.

## 2018-04-18 ENCOUNTER — Other Ambulatory Visit: Payer: Self-pay | Admitting: Internal Medicine

## 2018-04-18 DIAGNOSIS — E221 Hyperprolactinemia: Secondary | ICD-10-CM

## 2018-05-06 ENCOUNTER — Other Ambulatory Visit: Payer: No Typology Code available for payment source

## 2018-05-07 ENCOUNTER — Ambulatory Visit
Admission: RE | Admit: 2018-05-07 | Discharge: 2018-05-07 | Disposition: A | Discharge status: Home / Self Care | Payer: No Typology Code available for payment source | Source: Ambulatory Visit | Attending: Internal Medicine | Admitting: Internal Medicine

## 2018-05-07 DIAGNOSIS — E221 Hyperprolactinemia: Secondary | ICD-10-CM

## 2018-05-07 MED ORDER — GADOBENATE DIMEGLUMINE 529 MG/ML IV SOLN
7.0000 mL | Freq: Once | INTRAVENOUS | Status: AC | PRN
  Administered 2018-05-07: 7 mL via INTRAVENOUS

## 2018-12-10 IMAGING — MG 2D DIGITAL DIAGNOSTIC BILATERAL MAMMOGRAM WITH CAD AND ADJUNCT T
8 of 17 series · 8 of 40 positions shown · non-contrast
Comparison: 03/09/2016 and earlier

CLINICAL DATA: Patient reports persistent bilateral spontaneous
discharge.MRI is performed in March 2016, demonstrating two
enhancing masses in the left breast. MR guided biopsy was not
technically possible because of the small size of the breast.
Subsequently, the patient had ultrasound-guided core biopsies of
single mass in the left breast, revealing proliferative fibrocystic
changes with florid ductal hyperplasia in the 12 o'clock location of
the left breast. Follow-up MRI was recommended in 6 months. The
patient is also followed by Dr. Parsa. Patient reports persistent
bilateral spontaneous nipple discharge.

EXAM:
2D DIGITAL DIAGNOSTIC BILATERAL MAMMOGRAM WITH CAD AND ADJUNCT TOMO

[L CC]
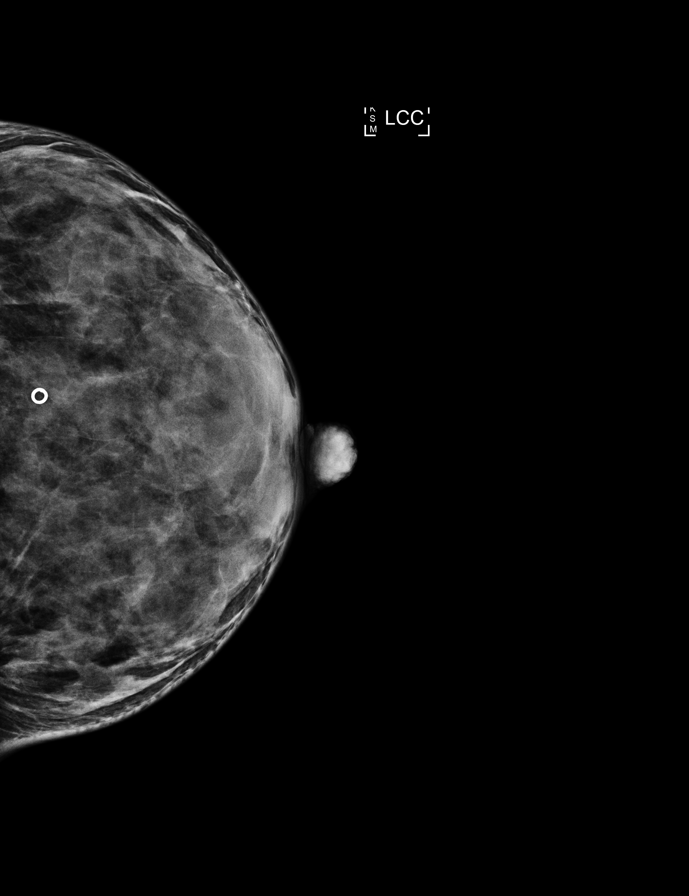

[R MLO synth-2D (1 of 2)]
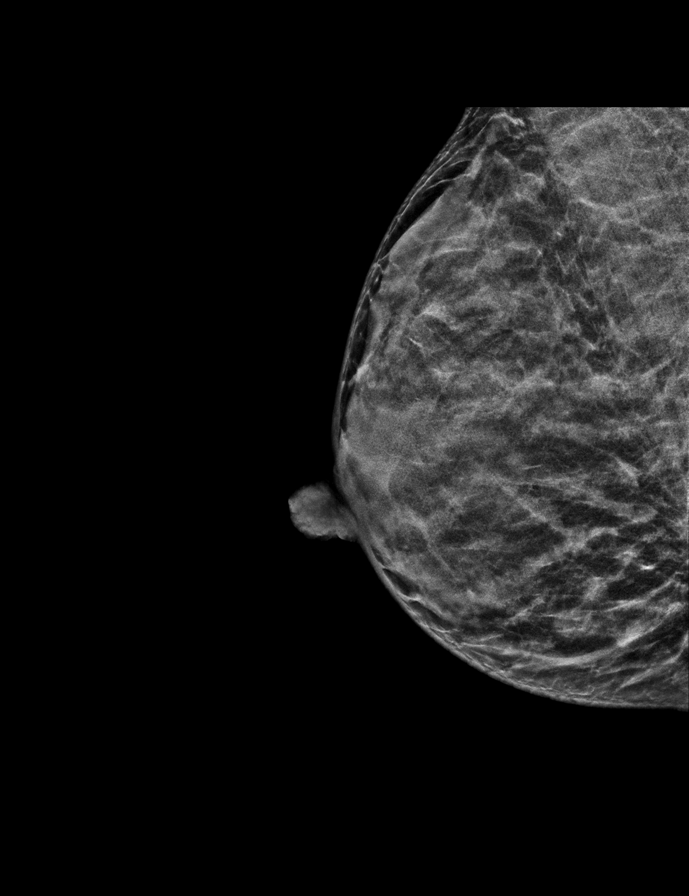

[L MLO synth-2D (1 of 2)]
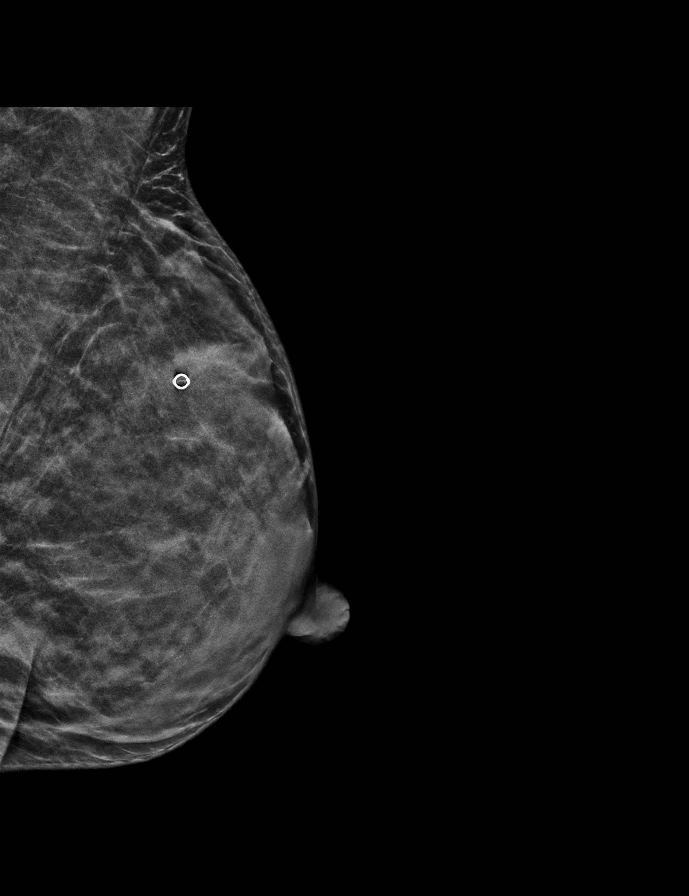

[L MLO]
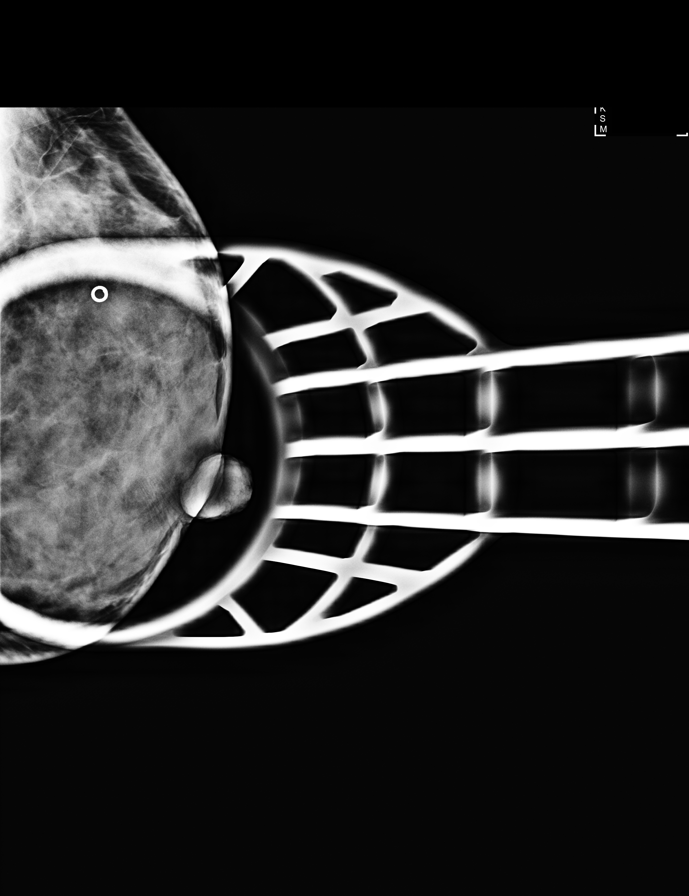

[R MLO synth-2D (2 of 2)]
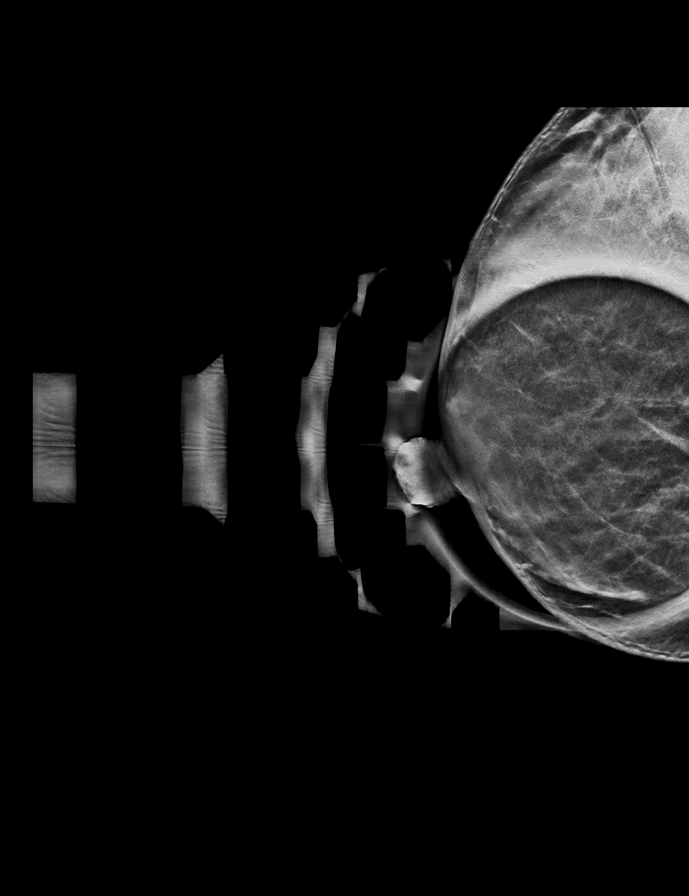

[R MLO (1 of 2)]
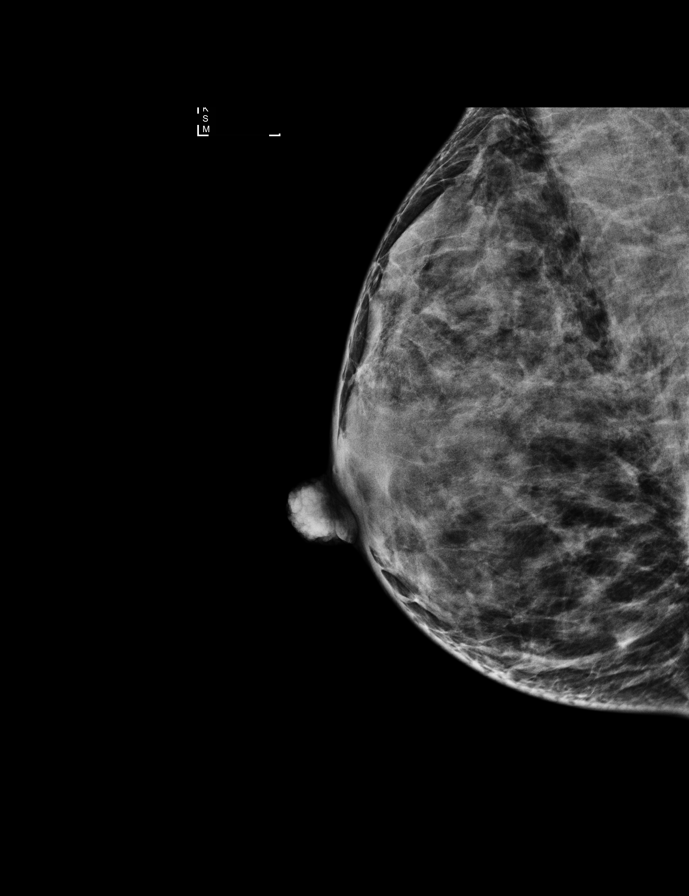

[L MLO synth-2D (2 of 2)]
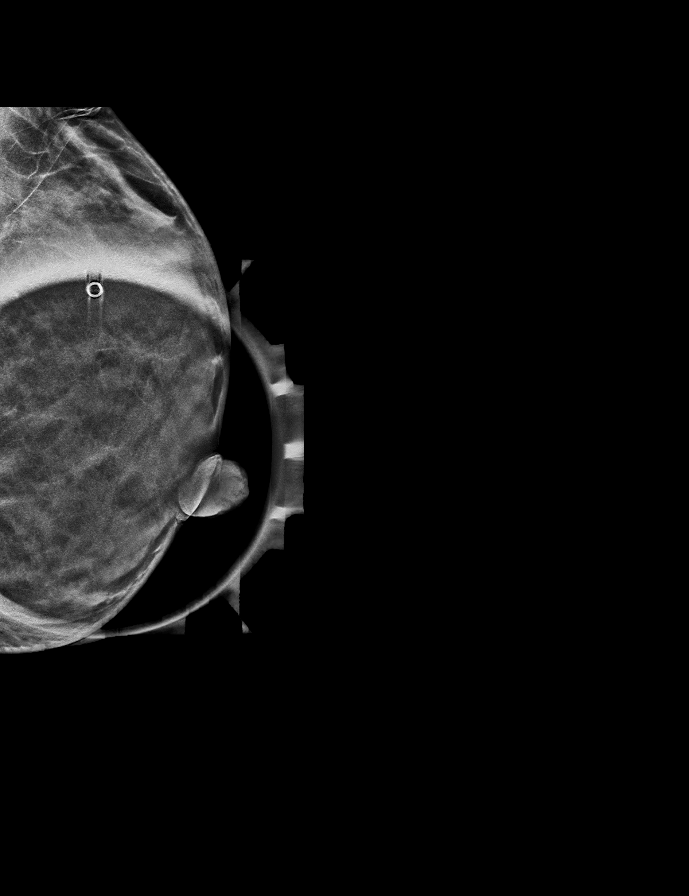

[R MLO (2 of 2)]
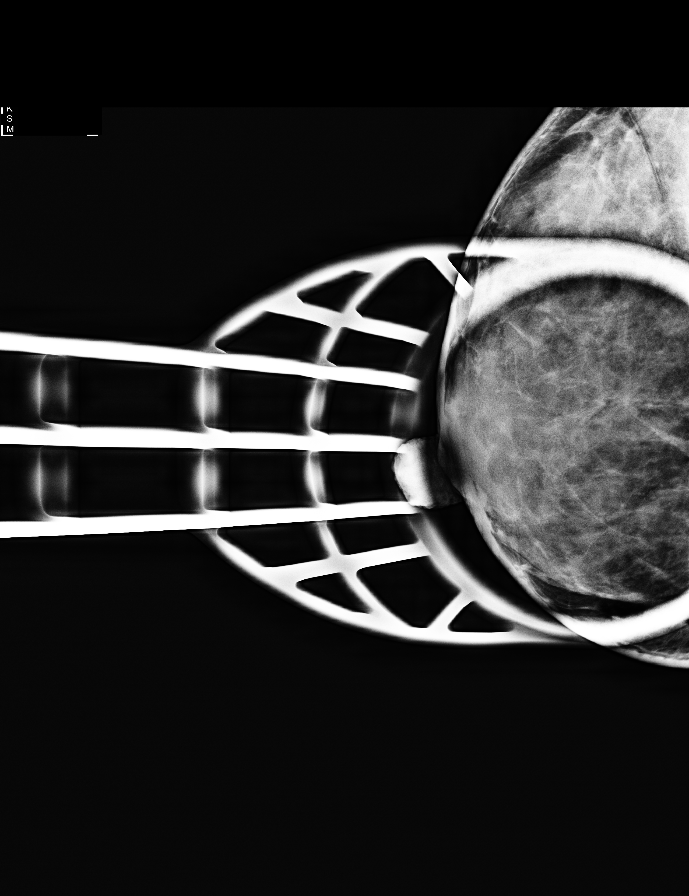

[8 of 40 positions shown; findings below may reference images not displayed]

ACR Breast Density Category d: The breast tissue is extremely dense,
which lowers the sensitivity of mammography.
FINDINGS: No suspicious mass, distortion, or microcalcifications are
identified to suggest presence of malignancy. Tissue marker clip is
identified in the upper central portion of the left breast.

Mammographic images were processed with CAD.
IMPRESSION: No mammographic or ultrasound evidence for malignancy.

RECOMMENDATION:
Follow-up bilateral breast MRI to evaluate lesions in the left
breast seen only on MRI and for evaluation of bilateral nipple
discharge.

Continued follow-up with Dr. Parsa.

I have discussed the findings and recommendations with the patient.
Results were also provided in writing at the conclusion of the
visit. If applicable, a reminder letter will be sent to the patient
regarding the next appointment.

BI-RADS CATEGORY  1: Negative.

## 2020-05-24 ENCOUNTER — Other Ambulatory Visit: Payer: Self-pay | Admitting: Physician Assistant

## 2020-05-25 ENCOUNTER — Other Ambulatory Visit: Payer: Self-pay | Admitting: Physician Assistant

## 2020-05-25 DIAGNOSIS — R1011 Right upper quadrant pain: Secondary | ICD-10-CM

## 2020-06-07 ENCOUNTER — Ambulatory Visit
Admission: RE | Admit: 2020-06-07 | Discharge: 2020-06-07 | Disposition: A | Discharge status: Home / Self Care | Payer: No Typology Code available for payment source | Source: Ambulatory Visit | Attending: Physician Assistant | Admitting: Physician Assistant

## 2020-06-07 DIAGNOSIS — R1011 Right upper quadrant pain: Secondary | ICD-10-CM

## 2020-06-21 ENCOUNTER — Other Ambulatory Visit: Payer: No Typology Code available for payment source

## 2020-06-22 ENCOUNTER — Ambulatory Visit
Admission: RE | Admit: 2020-06-22 | Discharge: 2020-06-22 | Disposition: A | Discharge status: Home / Self Care | Payer: No Typology Code available for payment source | Source: Ambulatory Visit | Attending: Physician Assistant | Admitting: Physician Assistant

## 2020-12-01 ENCOUNTER — Other Ambulatory Visit: Payer: Self-pay | Admitting: Internal Medicine

## 2020-12-01 DIAGNOSIS — Z1231 Encounter for screening mammogram for malignant neoplasm of breast: Secondary | ICD-10-CM

## 2020-12-09 ENCOUNTER — Emergency Department (HOSPITAL_COMMUNITY)
Admission: EM | Admit: 2020-12-09 | Discharge: 2020-12-09 | Disposition: A | Discharge status: Home / Self Care | Payer: No Typology Code available for payment source | Attending: Emergency Medicine | Admitting: Emergency Medicine

## 2020-12-09 ENCOUNTER — Emergency Department (HOSPITAL_COMMUNITY): Payer: No Typology Code available for payment source

## 2020-12-09 ENCOUNTER — Encounter (HOSPITAL_COMMUNITY): Payer: Self-pay

## 2020-12-09 ENCOUNTER — Other Ambulatory Visit: Payer: Self-pay

## 2020-12-09 DIAGNOSIS — Y92410 Unspecified street and highway as the place of occurrence of the external cause: Secondary | ICD-10-CM | POA: Diagnosis not present

## 2020-12-09 DIAGNOSIS — M79602 Pain in left arm: Secondary | ICD-10-CM | POA: Diagnosis not present

## 2020-12-09 DIAGNOSIS — S8011XA Contusion of right lower leg, initial encounter: Secondary | ICD-10-CM | POA: Diagnosis not present

## 2020-12-09 DIAGNOSIS — S8991XA Unspecified injury of right lower leg, initial encounter: Secondary | ICD-10-CM | POA: Diagnosis present

## 2020-12-09 DIAGNOSIS — M545 Low back pain, unspecified: Secondary | ICD-10-CM | POA: Diagnosis not present

## 2020-12-09 MED ORDER — IBUPROFEN 400 MG PO TABS
400.0000 mg | ORAL_TABLET | Freq: Once | ORAL | Status: AC
  Administered 2020-12-09: 400 mg via ORAL
  Filled 2020-12-09: qty 1

## 2020-12-09 MED ORDER — ACETAMINOPHEN 500 MG PO TABS
1000.0000 mg | ORAL_TABLET | Freq: Once | ORAL | Status: AC
  Administered 2020-12-09: 1000 mg via ORAL
  Filled 2020-12-09: qty 2
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
## 2020-12-09 NOTE — ED Notes (Signed)
Patient discharge instructions reviewed with the patient. The patient verbalized understanding of instructions. Patient discharged. 
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
## 2020-12-09 NOTE — Discharge Instructions (Addendum)
Your imaging was negative for fracture.  Please take ibuprofen and Tylenol every 6 hours for the next 2 days.

## 2021-01-06 ENCOUNTER — Other Ambulatory Visit: Payer: Self-pay | Admitting: Internal Medicine

## 2021-01-06 DIAGNOSIS — Z1231 Encounter for screening mammogram for malignant neoplasm of breast: Secondary | ICD-10-CM

## 2021-01-13 ENCOUNTER — Ambulatory Visit
Admission: RE | Admit: 2021-01-13 | Discharge: 2021-01-13 | Disposition: A | Discharge status: Home / Self Care | Payer: No Typology Code available for payment source | Source: Ambulatory Visit | Attending: Internal Medicine | Admitting: Internal Medicine

## 2021-01-13 ENCOUNTER — Other Ambulatory Visit: Payer: Self-pay

## 2021-01-13 DIAGNOSIS — Z1231 Encounter for screening mammogram for malignant neoplasm of breast: Secondary | ICD-10-CM

## 2021-01-18 ENCOUNTER — Other Ambulatory Visit: Payer: Self-pay | Admitting: Internal Medicine

## 2021-01-18 DIAGNOSIS — D352 Benign neoplasm of pituitary gland: Secondary | ICD-10-CM

## 2021-02-23 ENCOUNTER — Other Ambulatory Visit: Payer: Self-pay

## 2021-02-23 ENCOUNTER — Ambulatory Visit
Admission: RE | Admit: 2021-02-23 | Discharge: 2021-02-23 | Disposition: A | Discharge status: Home / Self Care | Payer: Self-pay | Source: Ambulatory Visit | Attending: Internal Medicine | Admitting: Internal Medicine

## 2021-02-23 DIAGNOSIS — D352 Benign neoplasm of pituitary gland: Secondary | ICD-10-CM

## 2021-02-23 MED ORDER — GADOBENATE DIMEGLUMINE 529 MG/ML IV SOLN
6.0000 mL | Freq: Once | INTRAVENOUS | Status: AC | PRN
  Administered 2021-02-23: 6 mL via INTRAVENOUS

## 2021-05-10 ENCOUNTER — Other Ambulatory Visit: Payer: Self-pay | Admitting: Obstetrics and Gynecology

## 2021-05-10 DIAGNOSIS — N644 Mastodynia: Secondary | ICD-10-CM

## 2021-06-14 ENCOUNTER — Ambulatory Visit
Admission: RE | Admit: 2021-06-14 | Discharge: 2021-06-14 | Disposition: A | Discharge status: Home / Self Care | Payer: No Typology Code available for payment source | Source: Ambulatory Visit | Attending: Obstetrics and Gynecology | Admitting: Obstetrics and Gynecology

## 2021-06-14 ENCOUNTER — Other Ambulatory Visit: Payer: Self-pay

## 2021-06-14 ENCOUNTER — Other Ambulatory Visit: Payer: Self-pay | Admitting: Obstetrics and Gynecology

## 2021-06-14 DIAGNOSIS — N644 Mastodynia: Secondary | ICD-10-CM

## 2022-10-17 ENCOUNTER — Other Ambulatory Visit: Payer: Self-pay | Admitting: Obstetrics and Gynecology

## 2022-10-17 DIAGNOSIS — Z1231 Encounter for screening mammogram for malignant neoplasm of breast: Secondary | ICD-10-CM

## 2022-11-10 ENCOUNTER — Ambulatory Visit
Admission: RE | Admit: 2022-11-10 | Discharge: 2022-11-10 | Disposition: A | Discharge status: Home / Self Care | Payer: No Typology Code available for payment source | Source: Ambulatory Visit | Attending: Obstetrics and Gynecology | Admitting: Obstetrics and Gynecology

## 2022-11-10 DIAGNOSIS — Z1231 Encounter for screening mammogram for malignant neoplasm of breast: Secondary | ICD-10-CM

## 2023-10-17 ENCOUNTER — Other Ambulatory Visit: Payer: Self-pay | Admitting: Obstetrics and Gynecology

## 2023-10-17 DIAGNOSIS — Z1231 Encounter for screening mammogram for malignant neoplasm of breast: Secondary | ICD-10-CM

## 2023-12-27 ENCOUNTER — Ambulatory Visit
Admission: RE | Admit: 2023-12-27 | Discharge: 2023-12-27 | Disposition: A | Discharge status: Home / Self Care | Source: Ambulatory Visit | Attending: Obstetrics and Gynecology | Admitting: Obstetrics and Gynecology

## 2023-12-27 DIAGNOSIS — Z1231 Encounter for screening mammogram for malignant neoplasm of breast: Secondary | ICD-10-CM

## 2024-01-28 ENCOUNTER — Ambulatory Visit (INDEPENDENT_AMBULATORY_CARE_PROVIDER_SITE_OTHER): Payer: Self-pay | Admitting: Obstetrics and Gynecology

## 2024-01-28 ENCOUNTER — Encounter: Payer: Self-pay | Admitting: Obstetrics and Gynecology

## 2024-01-28 VITALS — BP 100/72 | HR 58 | Temp 97.6°F | Ht 64.0 in | Wt 128.0 lb

## 2024-01-28 DIAGNOSIS — N9489 Other specified conditions associated with female genital organs and menstrual cycle: Secondary | ICD-10-CM

## 2024-01-28 DIAGNOSIS — N939 Abnormal uterine and vaginal bleeding, unspecified: Secondary | ICD-10-CM

## 2024-01-28 NOTE — Progress Notes (Signed)
 43 y.o. [name] female with AUB and endometrial mass here for referral for surgical consultation. Married. Referred by Margarete SHIPPER. Due to language barrier, an interpreter was present during the history-taking and subsequent discussion (and for part of the physical exam) with this patient.  No LMP recorded.   She reports spotting following exercise. This started earlier this year. Cycle are otherwise monthly with normal flow and cramps. Hx of hyst, D&C 2018 for AUB  01/03/2024 TVUS: 11.6 x 5.7 x 6.5 cm uterus with 1.2 cm endometrium, 2 ?hyperechoic masses noted: 1 x 0.8 cm (fundal) and 1.1 x 0.8 cm (posterior wall); normal ovaries.  Works out regularly, no CP or SOB with vigorous exercise.  Last PAP:    Component Value Date/Time   DIAGPAP  02/01/2017 0000    NEGATIVE FOR INTRAEPITHELIAL LESIONS OR MALIGNANCY.   ADEQPAP  02/01/2017 0000    Satisfactory for evaluation  endocervical/transformation zone component PRESENT.   Birth control: none Sexually active: yes   GYN HISTORY: No sig hx  OB History  Gravida Para Term Preterm AB Living  5 2 2  3 2   SAB IAB Ectopic Multiple Live Births  3    2    # Outcome Date GA Lbr Len/2nd Weight Sex Type Anes PTL Lv  5 SAB           4 SAB           3 SAB           2 Term      Vag-Spont   LIV  1 Term      Vag-Spont   LIV    Past Medical History:  Diagnosis Date   Breast discharge    bil for 2 years white spontaneous   Cough 04/11/2012   Hemorrhoids, external 03/2012   symptomatic   Hyperlipidemia    no current med.   Indigestion    takes OTC as needed    Past Surgical History:  Procedure Laterality Date   BAND HEMORRHOIDECTOMY  01/2012   BREAST EXCISIONAL BIOPSY Left 2018   BREAST LUMPECTOMY WITH RADIOACTIVE SEED LOCALIZATION Left 02/07/2017   PATH: ductal papilloma; no malignancy.  Procedure: LEFT BREAST LUMPECTOMY WITH RADIOACTIVE SEED LOCALIZATION;  Surgeon: Vernetta Berg, MD;  Location: [facility];   Service: General;  Laterality: Left;   DILATATION & CURETTAGE/HYSTEROSCOPY WITH MYOSURE N/A 04/11/2017   PATH= BENIGN. Procedure: DILATATION & CURETTAGE/HYSTEROSCOPY WITH MYOSURE POLYPECTOMY;  Surgeon: Rosalva Sawyer, MD;  Location: [facility];  Service: Gynecology;  Laterality: N/A;  Possible Polypectomy   HEMORRHOID SURGERY  04/17/2012   Procedure: HEMORRHOIDECTOMY;  Surgeon: Berg DELENA Vernetta, MD;  Location: [facility];  Service: General;  Laterality: N/A;  2 column external /internal hemorrhoidectomy    No current outpatient medications on file prior to visit.   No current facility-administered medications on file prior to visit.    No Known Allergies    PE Today's Vitals   01/28/24 1357  BP: 100/72  Pulse: (!) 58  Temp: 97.6 F (36.4 C)  TempSrc: Oral  SpO2: 98%  Weight: 128 lb (58.1 kg)  Height: 5' 4 (1.626 m)   Body mass index is 21.97 kg/m.  Physical Exam Vitals reviewed.  Constitutional:      General: She is not in acute distress.    Appearance: Normal appearance.  HENT:     Head: Normocephalic and atraumatic.     Nose: Nose normal.  Eyes:     Extraocular  Movements: Extraocular movements intact.     Conjunctiva/sclera: Conjunctivae normal.  Cardiovascular:     Rate and Rhythm: Normal rate and regular rhythm.     Heart sounds: No murmur heard.    No friction rub. No gallop.  Pulmonary:     Effort: Pulmonary effort is normal. No respiratory distress.     Breath sounds: Normal breath sounds. No stridor. No wheezing, rhonchi or rales.  Musculoskeletal:        General: Normal range of motion.     Cervical back: Normal range of motion.  Neurological:     General: No focal deficit present.     Mental Status: She is alert.  Psychiatric:        Mood and Affect: Mood normal.        Behavior: Behavior normal.      Assessment and Plan:        Abnormal uterine bleeding (AUB) -     Ambulatory Referral For Surgery Scheduling  Endometrial  mass Assessment & Plan: Reviewed records including TVUS Plan for hysteroscopy, dilation and curettage, polypectomy. Reviewed ~5% risk of malignancy with endometrial polyps  Discussed outpatient procedure. Reviewed that  recovery is usually 1-2 days. Risks including infections, bleeding, and damage to surrounding organs reviewed. Recommend NPO prior to midnight and reviewed medication to take on day of surgery. Dicussed use of NSAIDS as needed for pain postoperatively.  Preop checklist: Antibiotics: none DVT ppx: SCDs Postop visit: 1 week Additional clearance: none    Orders: -     Ambulatory Referral For Surgery Scheduling   Vera LULLA Pa, MD

## 2024-01-28 NOTE — Assessment & Plan Note (Addendum)
 Reviewed records including TVUS Plan for hysteroscopy, dilation and curettage, polypectomy. Reviewed ~5% risk of malignancy with endometrial polyps  Discussed outpatient procedure. Reviewed that  recovery is usually 1-2 days. Risks including infections, bleeding, and damage to surrounding organs reviewed. Recommend NPO prior to midnight and reviewed medication to take on day of surgery. Dicussed use of NSAIDS as needed for pain postoperatively.  Preop checklist: Antibiotics: none DVT ppx: SCDs Postop visit: 1 week Additional clearance: none

## 2024-03-12 ENCOUNTER — Ambulatory Visit (INDEPENDENT_AMBULATORY_CARE_PROVIDER_SITE_OTHER): Payer: Self-pay | Admitting: Obstetrics and Gynecology

## 2024-03-12 ENCOUNTER — Encounter: Payer: Self-pay | Admitting: Obstetrics and Gynecology

## 2024-03-12 VITALS — BP 104/60 | HR 70 | Temp 97.6°F | Ht 65.5 in | Wt 132.0 lb

## 2024-03-12 DIAGNOSIS — N939 Abnormal uterine and vaginal bleeding, unspecified: Secondary | ICD-10-CM

## 2024-03-12 DIAGNOSIS — N9489 Other specified conditions associated with female genital organs and menstrual cycle: Secondary | ICD-10-CM

## 2024-03-12 NOTE — Progress Notes (Signed)
 43 y.o. [name] female with AUB and endometrial mass here for preop exam: hysteroscopy D&C, polypectomy scheduled 03/25/2024. Married. Referred by Margarete SHIPPER. Due to language barrier, an interpreter was present during the history-taking and subsequent discussion (and for part of the physical exam) with this patient.  Patient's last menstrual period was 03/07/2024 (exact date). Period Duration (Days): 6 Period Pattern: Regular Menstrual Flow: Moderate Menstrual Control: Maxi pad Dysmenorrhea: (!) Mild She reports spotting following exercise. This started earlier this year. Cycle are otherwise monthly with normal flow and cramps. Hx of hyst, D&C 2018 for AUB  No recent spotting, normal cycle with cramping.  01/03/2024 TVUS: 11.6 x 5.7 x 6.5 cm uterus with 1.2 cm endometrium, 2 ?hyperechoic masses noted: 1 x 0.8 cm (fundal) and 1.1 x 0.8 cm (posterior wall); normal ovaries.  Works out regularly, no CP or SOB with vigorous exercise.  Last PAP:    Component Value Date/Time   DIAGPAP  02/01/2017 0000    NEGATIVE FOR INTRAEPITHELIAL LESIONS OR MALIGNANCY.   ADEQPAP  02/01/2017 0000    Satisfactory for evaluation  endocervical/transformation zone component PRESENT.   Birth control: none Sexually active: yes   GYN HISTORY: No sig hx  OB History  Gravida Para Term Preterm AB Living  5 2 2  3 2   SAB IAB Ectopic Multiple Live Births  3    2    # Outcome Date GA Lbr Len/2nd Weight Sex Type Anes PTL Lv  5 SAB           4 SAB           3 SAB           2 Term      Vag-Spont   LIV  1 Term      Vag-Spont   LIV    Past Medical History:  Diagnosis Date   Breast discharge    bil for 2 years white spontaneous   Cough 04/11/2012   Hemorrhoids, external 03/2012   symptomatic   Hyperlipidemia    no current med.   Indigestion    takes OTC as needed    Past Surgical History:  Procedure Laterality Date   BAND HEMORRHOIDECTOMY  01/2012   BREAST EXCISIONAL BIOPSY Left 2018    BREAST LUMPECTOMY WITH RADIOACTIVE SEED LOCALIZATION Left 02/07/2017   PATH: ductal papilloma; no malignancy.  Procedure: LEFT BREAST LUMPECTOMY WITH RADIOACTIVE SEED LOCALIZATION;  Surgeon: Vernetta Berg, MD;  Location: [facility];  Service: General;  Laterality: Left;   DILATATION & CURETTAGE/HYSTEROSCOPY WITH MYOSURE N/A 04/11/2017   PATH= BENIGN. Procedure: DILATATION & CURETTAGE/HYSTEROSCOPY WITH MYOSURE POLYPECTOMY;  Surgeon: Rosalva Sawyer, MD;  Location: [facility];  Service: Gynecology;  Laterality: N/A;  Possible Polypectomy   HEMORRHOID SURGERY  04/17/2012   Procedure: HEMORRHOIDECTOMY;  Surgeon: Berg DELENA Vernetta, MD;  Location: [facility];  Service: General;  Laterality: N/A;  2 column external /internal hemorrhoidectomy    No current outpatient medications on file prior to visit.   No current facility-administered medications on file prior to visit.    No Known Allergies    PE Today's Vitals   03/12/24 0842  BP: 104/60  Pulse: 70  Temp: 97.6 F (36.4 C)  TempSrc: Oral  SpO2: 98%  Weight: 132 lb (59.9 kg)  Height: 5' 5.5 (1.664 m)   Body mass index is 21.63 kg/m.  Physical Exam Vitals reviewed.  Constitutional:      General: She is not in acute distress.  Appearance: Normal appearance.  HENT:     Head: Normocephalic and atraumatic.     Nose: Nose normal.  Eyes:     Extraocular Movements: Extraocular movements intact.     Conjunctiva/sclera: Conjunctivae normal.  Cardiovascular:     Rate and Rhythm: Normal rate and regular rhythm.     Heart sounds: No murmur heard.    No friction rub. No gallop.  Pulmonary:     Effort: Pulmonary effort is normal. No respiratory distress.     Breath sounds: Normal breath sounds. No stridor. No wheezing, rhonchi or rales.  Musculoskeletal:        General: Normal range of motion.     Cervical back: Normal range of motion.  Neurological:     General: No focal deficit present.     Mental  Status: She is alert.  Psychiatric:        Mood and Affect: Mood normal.        Behavior: Behavior normal.      Assessment and Plan:        Abnormal uterine bleeding (AUB)  Endometrial mass Assessment & Plan: Plan for hysteroscopy, dilation and curettage, polypectomy on 03/25/24.  Discussed outpatient procedure. Reviewed that  recovery is usually 1-2 days. Risks including infections, bleeding, and damage to surrounding organs reviewed. Recommend NPO prior to midnight and reviewed medication to take on day of surgery. Dicussed use of NSAIDS as needed for pain postoperatively.  Preop checklist: Antibiotics: none DVT ppx: SCDs Postop visit: 1 week Additional clearance: none       Vera LULLA Pa, MD

## 2024-03-12 NOTE — Assessment & Plan Note (Signed)
 Plan for hysteroscopy, dilation and curettage, polypectomy on 03/25/24.  Discussed outpatient procedure. Reviewed that  recovery is usually 1-2 days. Risks including infections, bleeding, and damage to surrounding organs reviewed. Recommend NPO prior to midnight and reviewed medication to take on day of surgery. Dicussed use of NSAIDS as needed for pain postoperatively.  Preop checklist: Antibiotics: none DVT ppx: SCDs Postop visit: 1 week Additional clearance: none

## 2024-03-12 NOTE — H&P (View-Only) (Signed)
 43 y.o. [name] female with AUB and endometrial mass here for preop exam: hysteroscopy D&C, polypectomy scheduled 03/25/2024. Married. Referred by Anna Chen. Due to language barrier, an interpreter was present during the history-taking and subsequent discussion (and for part of the physical exam) with this patient.  Patient's last menstrual period was 03/07/2024 (exact date). Period Duration (Days): 6 Period Pattern: Regular Menstrual Flow: Moderate Menstrual Control: Maxi pad Dysmenorrhea: (!) Mild She reports spotting following exercise. This started earlier this year. Cycle are otherwise monthly with normal flow and cramps. Hx of hyst, D&C 2018 for AUB  No recent spotting, normal cycle with cramping.  01/03/2024 TVUS: 11.6 x 5.7 x 6.5 cm uterus with 1.2 cm endometrium, 2 ?hyperechoic masses noted: 1 x 0.8 cm (fundal) and 1.1 x 0.8 cm (posterior wall); normal ovaries.  Works out regularly, no CP or SOB with vigorous exercise.  Last PAP:    Component Value Date/Time   DIAGPAP  02/01/2017 0000    NEGATIVE FOR INTRAEPITHELIAL LESIONS OR MALIGNANCY.   ADEQPAP  02/01/2017 0000    Satisfactory for evaluation  endocervical/transformation zone component PRESENT.   Birth control: none Sexually active: yes   GYN HISTORY: No sig hx  OB History  Gravida Para Term Preterm AB Living  5 2 2  3 2   SAB IAB Ectopic Multiple Live Births  3    2    # Outcome Date GA Lbr Len/2nd Weight Sex Type Anes PTL Lv  5 SAB           4 SAB           3 SAB           2 Term      Vag-Spont   LIV  1 Term      Vag-Spont   LIV    Past Medical History:  Diagnosis Date   Breast discharge    bil for 2 years white spontaneous   Cough 04/11/2012   Hemorrhoids, external 03/2012   symptomatic   Hyperlipidemia    no current med.   Indigestion    takes OTC as needed    Past Surgical History:  Procedure Laterality Date   BAND HEMORRHOIDECTOMY  01/2012   BREAST EXCISIONAL BIOPSY Left 2018    BREAST LUMPECTOMY WITH RADIOACTIVE SEED LOCALIZATION Left 02/07/2017   PATH: ductal papilloma; no malignancy.  Procedure: LEFT BREAST LUMPECTOMY WITH RADIOACTIVE SEED LOCALIZATION;  Surgeon: Anna Berg, MD;  Location: [facility];  Service: General;  Laterality: Left;   DILATATION & CURETTAGE/HYSTEROSCOPY WITH MYOSURE N/A 04/11/2017   PATH= BENIGN. Procedure: DILATATION & CURETTAGE/HYSTEROSCOPY WITH MYOSURE POLYPECTOMY;  Surgeon: Anna Sawyer, MD;  Location: [facility];  Service: Gynecology;  Laterality: N/A;  Possible Polypectomy   HEMORRHOID SURGERY  04/17/2012   Procedure: HEMORRHOIDECTOMY;  Surgeon: Chen Anna Vernetta, MD;  Location: [facility];  Service: General;  Laterality: N/A;  2 column external /internal hemorrhoidectomy    No current outpatient medications on file prior to visit.   No current facility-administered medications on file prior to visit.    No Known Allergies    PE Today's Vitals   03/12/24 0842  BP: 104/60  Pulse: 70  Temp: 97.6 F (36.4 C)  TempSrc: Oral  SpO2: 98%  Weight: 132 lb (59.9 kg)  Height: 5' 5.5 (1.664 m)   Body mass index is 21.63 kg/m.  Physical Exam Vitals reviewed.  Constitutional:      General: She is not in acute distress.  Appearance: Normal appearance.  HENT:     Head: Normocephalic and atraumatic.     Nose: Nose normal.  Eyes:     Extraocular Movements: Extraocular movements intact.     Conjunctiva/sclera: Conjunctivae normal.  Cardiovascular:     Rate and Rhythm: Normal rate and regular rhythm.     Heart sounds: No murmur heard.    No friction rub. No gallop.  Pulmonary:     Effort: Pulmonary effort is normal. No respiratory distress.     Breath sounds: Normal breath sounds. No stridor. No wheezing, rhonchi or rales.  Musculoskeletal:        General: Normal range of motion.     Cervical back: Normal range of motion.  Neurological:     General: No focal deficit present.     Mental  Status: She is alert.  Psychiatric:        Mood and Affect: Mood normal.        Behavior: Behavior normal.      Assessment and Plan:        Abnormal uterine bleeding (AUB)  Endometrial mass Assessment & Plan: Plan for hysteroscopy, dilation and curettage, polypectomy on 03/25/24.  Discussed outpatient procedure. Reviewed that  recovery is usually 1-2 days. Risks including infections, bleeding, and damage to surrounding organs reviewed. Recommend NPO prior to midnight and reviewed medication to take on day of surgery. Dicussed use of NSAIDS as needed for pain postoperatively.  Preop checklist: Antibiotics: none DVT ppx: SCDs Postop visit: 1 week Additional clearance: none       Anna LULLA Pa, MD

## 2024-03-19 ENCOUNTER — Encounter (HOSPITAL_COMMUNITY): Payer: Self-pay | Admitting: Obstetrics and Gynecology

## 2024-03-19 NOTE — Progress Notes (Signed)
 Received Mandarin Interpreter confirmation from [facility] interpreting via email, copy with chart.

## 2024-03-19 NOTE — Progress Notes (Signed)
 Spoke w/ via phone for pre-op interview--- [place] [employer] interpreter ID# [phone] Lab needs dos---- UPT per surgeon.        Lab results------ COVID test -----patient states asymptomatic no test needed Arrive at -------1250 NPO after MN NO Solid Food.  Clear liquids from MN until---1150 Pre-Surgery Ensure or G2:  Med rec completed Medications to take morning of surgery -----NONE Diabetic medication -----  GLP1 agonist last dose: GLP1 instructions:  Patient instructed no nail polish to be worn day of surgery Patient instructed to bring photo id and insurance card day of surgery Patient aware to have Driver (ride ) / caregiver    for 24 hours after surgery - Sister in law Marleen Ruth Patient Special Instructions ----- Pre-Op special Instructions ----- Mandarin interpreter requested for day of surgery 03/19/24.  Patient verbalized understanding of instructions that were given at this phone interview. Patient denies chest pain, sob, fever, cough at the interview.

## 2024-03-25 ENCOUNTER — Ambulatory Visit (HOSPITAL_COMMUNITY): Payer: Self-pay | Admitting: Anesthesiology

## 2024-03-25 ENCOUNTER — Encounter (HOSPITAL_COMMUNITY)
Admission: RE | Disposition: A | Discharge status: Home / Self Care | Payer: Self-pay | Source: Home / Self Care | Attending: Obstetrics and Gynecology

## 2024-03-25 ENCOUNTER — Encounter (HOSPITAL_COMMUNITY): Payer: Self-pay | Admitting: Obstetrics and Gynecology

## 2024-03-25 ENCOUNTER — Ambulatory Visit (HOSPITAL_COMMUNITY)
Admission: RE | Admit: 2024-03-25 | Discharge: 2024-03-25 | Disposition: A | Discharge status: Home / Self Care | Payer: Self-pay | Attending: Obstetrics and Gynecology | Admitting: Obstetrics and Gynecology

## 2024-03-25 DIAGNOSIS — N9489 Other specified conditions associated with female genital organs and menstrual cycle: Secondary | ICD-10-CM

## 2024-03-25 DIAGNOSIS — N939 Abnormal uterine and vaginal bleeding, unspecified: Secondary | ICD-10-CM

## 2024-03-25 DIAGNOSIS — N840 Polyp of corpus uteri: Secondary | ICD-10-CM | POA: Diagnosis present

## 2024-03-25 HISTORY — PX: DILATATION & CURRETTAGE/HYSTEROSCOPY WITH RESECTOCOPE: SHX5572

## 2024-03-25 HISTORY — PX: MYOSURE RESECTION: SHX7611

## 2024-03-25 LAB — POCT PREGNANCY, URINE: Preg Test, Ur: NEGATIVE

## 2024-03-25 SURGERY — DILATATION & CURETTAGE/HYSTEROSCOPY WITH RESECTOCOPE
Anesthesia: General

## 2024-03-25 MED ORDER — ACETAMINOPHEN 500 MG PO TABS
ORAL_TABLET | ORAL | Status: AC
  Filled 2024-03-25: qty 2

## 2024-03-25 MED ORDER — LIDOCAINE 2% (20 MG/ML) 5 ML SYRINGE
INTRAMUSCULAR | Status: AC
  Filled 2024-03-25: qty 5

## 2024-03-25 MED ORDER — CELECOXIB 200 MG PO CAPS
ORAL_CAPSULE | ORAL | Status: AC
  Filled 2024-03-25: qty 2

## 2024-03-25 MED ORDER — MIDAZOLAM HCL 2 MG/2ML IJ SOLN
INTRAMUSCULAR | Status: AC
Start: 2024-03-25 — End: 2024-03-25
  Filled 2024-03-25: qty 2

## 2024-03-25 MED ORDER — PROPOFOL 10 MG/ML IV BOLUS
INTRAVENOUS | Status: DC | PRN
  Administered 2024-03-25: 100 ug/kg/min via INTRAVENOUS
  Administered 2024-03-25: 130 mg via INTRAVENOUS

## 2024-03-25 MED ORDER — FENTANYL CITRATE (PF) 100 MCG/2ML IJ SOLN: 25.0000 ug | INTRAMUSCULAR | Status: DC | PRN

## 2024-03-25 MED ORDER — CELECOXIB 200 MG PO CAPS
400.0000 mg | ORAL_CAPSULE | ORAL | Status: AC
  Administered 2024-03-25: 400 mg via ORAL

## 2024-03-25 MED ORDER — ACETAMINOPHEN 500 MG PO TABS
1000.0000 mg | ORAL_TABLET | ORAL | Status: AC
  Administered 2024-03-25: 1000 mg via ORAL

## 2024-03-25 MED ORDER — SILVER NITRATE-POT NITRATE 75-25 % EX MISC
CUTANEOUS | Status: DC | PRN
  Administered 2024-03-25: 2 via TOPICAL

## 2024-03-25 MED ORDER — DEXAMETHASONE SOD PHOSPHATE PF 10 MG/ML IJ SOLN
INTRAMUSCULAR | Status: DC | PRN
Start: 2024-03-25 — End: 2024-03-25
  Administered 2024-03-25: 4 mg via INTRAVENOUS

## 2024-03-25 MED ORDER — LACTATED RINGERS IV SOLN: INTRAVENOUS | Status: DC

## 2024-03-25 MED ORDER — PROPOFOL 10 MG/ML IV BOLUS
INTRAVENOUS | Status: AC
  Filled 2024-03-25: qty 20

## 2024-03-25 MED ORDER — DROPERIDOL 2.5 MG/ML IJ SOLN: 0.6250 mg | Freq: Once | INTRAMUSCULAR | Status: DC | PRN

## 2024-03-25 MED ORDER — LIDOCAINE 2% (20 MG/ML) 5 ML SYRINGE
INTRAMUSCULAR | Status: DC | PRN
  Administered 2024-03-25: 80 mg via INTRAVENOUS

## 2024-03-25 MED ORDER — ONDANSETRON HCL 4 MG/2ML IJ SOLN
INTRAMUSCULAR | Status: DC | PRN
  Administered 2024-03-25: 4 mg via INTRAVENOUS

## 2024-03-25 MED ORDER — MIDAZOLAM HCL (PF) 2 MG/2ML IJ SOLN
INTRAMUSCULAR | Status: DC | PRN
  Administered 2024-03-25: 2 mg via INTRAVENOUS

## 2024-03-25 MED ORDER — SODIUM CHLORIDE 0.9 % IR SOLN
Status: DC | PRN
  Administered 2024-03-25: 3000 mL

## 2024-03-25 MED ORDER — CHLORHEXIDINE GLUCONATE 0.12 % MT SOLN
15.0000 mL | Freq: Once | OROMUCOSAL | Status: AC
  Administered 2024-03-25: 15 mL via OROMUCOSAL

## 2024-03-25 MED ORDER — CHLORHEXIDINE GLUCONATE 0.12 % MT SOLN
OROMUCOSAL | Status: AC
  Filled 2024-03-25: qty 15

## 2024-03-25 MED ORDER — ONDANSETRON HCL 4 MG/2ML IJ SOLN
INTRAMUSCULAR | Status: AC
  Filled 2024-03-25: qty 2

## 2024-03-25 MED ORDER — ORAL CARE MOUTH RINSE: 15.0000 mL | Freq: Once | OROMUCOSAL | Status: AC

## 2024-03-25 MED ORDER — FENTANYL CITRATE (PF) 100 MCG/2ML IJ SOLN
INTRAMUSCULAR | Status: AC
  Filled 2024-03-25: qty 2

## 2024-03-25 MED ORDER — LIDOCAINE HCL (PF) 1 % IJ SOLN
INTRAMUSCULAR | Status: DC | PRN
  Administered 2024-03-25: 10 mL

## 2024-03-25 MED ORDER — OXYCODONE HCL 5 MG PO TABS
ORAL_TABLET | ORAL | Status: AC
  Filled 2024-03-25: qty 1

## 2024-03-25 MED ORDER — OXYCODONE HCL 5 MG PO TABS
5.0000 mg | ORAL_TABLET | ORAL | Status: DC | PRN
  Administered 2024-03-25: 5 mg via ORAL

## 2024-03-25 SURGICAL SUPPLY — 16 items
DEVICE MYOSURE LITE (MISCELLANEOUS) IMPLANT
GLOVE BIO SURGEON STRL SZ7 (GLOVE) ×1 IMPLANT
GLOVE BIOGEL PI IND STRL 7.0 (GLOVE) ×1 IMPLANT
GLOVE BIOGEL PI IND STRL 7.5 (GLOVE) IMPLANT
GLOVE SURG SS PI 7.5 STRL IVOR (GLOVE) IMPLANT
GOWN STRL REUS W/ TWL LRG LVL3 (GOWN DISPOSABLE) IMPLANT
GOWN STRL REUS W/ TWL XL LVL3 (GOWN DISPOSABLE) ×1 IMPLANT
HIBICLENS CHG 4% 4OZ BTL (MISCELLANEOUS) ×1 IMPLANT
KIT PROCED FLUENT PRO FLT212S (KITS) ×1 IMPLANT
KIT TURNOVER KIT B (KITS) ×1 IMPLANT
PACK VAGINAL MINOR WOMEN LF (CUSTOM PROCEDURE TRAY) ×1 IMPLANT
PAD OB MATERNITY 11 LF (PERSONAL CARE ITEMS) ×1 IMPLANT
PAD PREP 24X48 CUFFED NSTRL (MISCELLANEOUS) ×1 IMPLANT
SEAL ROD LENS SCOPE MYOSURE (ABLATOR) ×1 IMPLANT
SOL .9 NS 3000ML IRR UROMATIC (IV SOLUTION) ×1 IMPLANT
TOWEL OR 17X24 6PK STRL BLUE (TOWEL DISPOSABLE) ×1 IMPLANT

## 2024-03-25 NOTE — Transfer of Care (Signed)
 Immediate Anesthesia Transfer of Care Note  Patient: Anna Chen  Procedure(s) Performed: DILATATION & CURETTAGE/DIAGNOSTIC HYSTEROSCOPY WITH POLYPECTOMY MYOSURE RESECTION  Patient Location: PACU  Anesthesia Type:General  Level of Consciousness: awake  Airway & Oxygen Therapy: Patient Spontanous Breathing  Post-op Assessment: Report given to RN and Post -op Vital signs reviewed and stable  Post vital signs: Reviewed and stable  Last Vitals:  Vitals Value Taken Time  BP 110/63 03/25/24 16:05  Temp    Pulse 54 03/25/24 16:07  Resp 12 03/25/24 16:07  SpO2 99 % 03/25/24 16:07  Vitals shown include unfiled device data.  Last Pain:  Vitals:   03/25/24 1320  TempSrc: Oral  PainSc: 0-No pain      Patients Stated Pain Goal: 3 (03/25/24 1320)  Complications: No notable events documented.

## 2024-03-25 NOTE — Progress Notes (Signed)
 Pt provided an additional urine sample due to the previous preg test result not found. POC Urine Preg test Negative.

## 2024-03-25 NOTE — Anesthesia Procedure Notes (Signed)
 Procedure Name: LMA Insertion Date/Time: 03/25/2024 3:31 PM  Performed by: Hermenia Fritcher A, CRNAPre-anesthesia Checklist: Patient identified, Emergency Drugs available, Suction available and Patient being monitored Patient Re-evaluated:Patient Re-evaluated prior to induction Oxygen Delivery Method: Circle System Utilized Preoxygenation: Pre-oxygenation with 100% oxygen Induction Type: IV induction Ventilation: Mask ventilation without difficulty LMA: LMA inserted LMA Size: 4.0 Number of attempts: 1 Airway Equipment and Method: Bite block Placement Confirmation: positive ETCO2 Tube secured with: Tape Dental Injury: Teeth and Oropharynx as per pre-operative assessment

## 2024-03-25 NOTE — Interval H&P Note (Signed)
 Date of Initial H&P: 03/12/24  History reviewed, patient examined, no change in status, stable for surgery. Due to language barrier, an interpreter was present during the history-taking and subsequent discussion (and for part of the physical exam) with this patient.

## 2024-03-25 NOTE — Discharge Instr - Supplementary Instructions (Signed)
 May take Tylenol  after 7:30pm if needed for discomfort.   May take Ibuprofen ,Advil  or Aleve after 7:30pm if needed for discomfort.

## 2024-03-25 NOTE — Op Note (Signed)
 03/25/2024  Anna Chen [id number]  OPERATIVE REPORT  Preop Diagnosis: Pre-Op Diagnosis Codes:     * Abnormal uterine bleeding (AUB) [N93.9]    * Endometrial mass [N94.89]   Post operative diagnosis: same  Procedure: Procedure(s): DILATATION & CURETTAGE/DIAGNOSTIC HYSTEROSCOPY WITH POLYPECTOMY MYOSURE RESECTION   Surgeon: Vera LULLA Pa, MD Assistant: none  Anesthesia: MAC Fluids: please see anesthesia report Fluid deficit: 190cc  Complications: None  Findings: Normal cervix. Uterine cavity measuring 10cm with both ostia seen. Multiple endometrial polyps were noted. Endocervical polyps were also noted and removed with the myosure.  Estimated blood loss: Minimal  Specimens:  ID Type Source Tests Collected by Time Destination  1 : endometrial polyps and curettings Tissue PATH Gyn biopsy SURGICAL PATHOLOGY Pa Vera LULLA, MD 03/25/2024 1506     Disposition of specimen: Pathology  Procedure: Patient was taken to the OR where she was placed in dorsal lithotomy in stirrups. She voided prior to transport. SCDs were in place.  The patient was prepped and draped in the usual sterile fashion. An adequate timeout was obtained and everyone agreed. A bivalve speculum was placed inside the vagina and the cervix visualized. The cervix was grasped anteriorly with a single-tooth tenaculum. Paracervical block was performed with 1% lidocaine .  The uterus sounded to 10 cm. Sequential cervical dilation was done to 14fr, and the myosure hysteroscope was introduced into the uterine cavity.  Findings as noted. Endometrial polyps were seen and completely morcellated using Myosure. The myosure was also used to sample the entire cavity.  A sharp curettage was then performed to ensure complete sampling of the cavity and was sent to pathology. All instrument, sponge and lap counts were correct x2. The patient was awakened taken to recovery room in stable condition.  Vera LULLA Pa, MD 03/25/2024  4:01  PM

## 2024-03-25 NOTE — Anesthesia Preprocedure Evaluation (Addendum)
 Anesthesia Evaluation  Patient identified by MRN, date of birth, ID band Patient awake    Reviewed: Allergy & Precautions, NPO status , Patient's Chart, lab work & pertinent test results  History of Anesthesia Complications Negative for: history of anesthetic complications  Airway Mallampati: I  TM Distance: >3 FB Neck ROM: Full    Dental  (+) Dental Advisory Given, Teeth Intact   Pulmonary neg pulmonary ROS   breath sounds clear to auscultation       Cardiovascular negative cardio ROS  Rhythm:Regular Rate:Normal     Neuro/Psych negative neurological ROS     GI/Hepatic negative GI ROS, Neg liver ROS,,,  Endo/Other  negative endocrine ROS    Renal/GU negative Renal ROS     Musculoskeletal   Abdominal   Peds  Hematology negative hematology ROS (+)   Anesthesia Other Findings   Reproductive/Obstetrics                              Anesthesia Physical Anesthesia Plan  ASA: 1  Anesthesia Plan: General   Post-op Pain Management: Celebrex  PO (pre-op)* and Tylenol  PO (pre-op)*   Induction: Intravenous  PONV Risk Score and Plan: 2 and Ondansetron , Dexamethasone , Propofol  infusion, TIVA, Treatment may vary due to age or medical condition and Midazolam   Airway Management Planned: LMA  Additional Equipment:   Intra-op Plan:   Post-operative Plan: Extubation in OR  Informed Consent: I have reviewed the patients History and Physical, chart, labs and discussed the procedure including the risks, benefits and alternatives for the proposed anesthesia with the patient or authorized representative who has indicated his/her understanding and acceptance.     Dental advisory given  Plan Discussed with: CRNA  Anesthesia Plan Comments:          Anesthesia Quick Evaluation

## 2024-03-25 NOTE — Discharge Instructions (Signed)

## 2024-03-26 ENCOUNTER — Encounter (HOSPITAL_COMMUNITY): Payer: Self-pay | Admitting: Obstetrics and Gynecology

## 2024-03-27 NOTE — Anesthesia Postprocedure Evaluation (Signed)
 Anesthesia Post Note  Patient: Anna Chen  Procedure(s) Performed: DILATATION & CURETTAGE/DIAGNOSTIC HYSTEROSCOPY WITH POLYPECTOMY MYOSURE RESECTION     Patient location during evaluation: PACU Anesthesia Type: General Level of consciousness: sedated and patient cooperative Pain management: pain level controlled Vital Signs Assessment: post-procedure vital signs reviewed and stable Respiratory status: spontaneous breathing Cardiovascular status: stable Anesthetic complications: no   No notable events documented.  Last Vitals:  Vitals:   03/25/24 1647 03/25/24 1650  BP: 102/67   Pulse: 63 66  Resp: 13 20  Temp: 36.7 C   SpO2: 98% 99%    Last Pain:  Vitals:   03/25/24 1650  TempSrc:   PainSc: 4                  Norleen Pope

## 2024-03-28 LAB — SURGICAL PATHOLOGY

## 2024-03-31 ENCOUNTER — Ambulatory Visit: Payer: Self-pay | Admitting: Obstetrics and Gynecology

## 2024-04-07 ENCOUNTER — Encounter: Payer: Self-pay | Admitting: Obstetrics and Gynecology

## 2024-04-07 ENCOUNTER — Ambulatory Visit (INDEPENDENT_AMBULATORY_CARE_PROVIDER_SITE_OTHER): Payer: Self-pay | Admitting: Obstetrics and Gynecology

## 2024-04-07 VITALS — BP 104/62 | HR 80 | Temp 97.7°F | Wt 131.0 lb

## 2024-04-07 DIAGNOSIS — N9489 Other specified conditions associated with female genital organs and menstrual cycle: Secondary | ICD-10-CM

## 2024-04-07 DIAGNOSIS — Z09 Encounter for follow-up examination after completed treatment for conditions other than malignant neoplasm: Secondary | ICD-10-CM

## 2024-04-07 NOTE — Progress Notes (Signed)
 43 y.o. [name] female 2wk s/p hysteroscopy D&C, polypectomy for AUB and endometrial mass here for postop exam. Married. Referred by Margarete SHIPPER. Due to language barrier, virtual interpreter [phone] was present during the history-taking and subsequent discussion (and for part of the physical exam) with this patient.  Patient's last menstrual period was 03/29/2024 (approximate). Period Duration (Days): 6 Period Pattern: Regular Menstrual Flow: Moderate Menstrual Control: Maxi pad Dysmenorrhea: None  Pt states she is doing well. Denies N/V, abdominal pain, VB, dysuria. Normal BM and voids. She has concerns with menstrual cycle, it was earlier and lighter.  03/25/24 path: A. ENDOMETRIUM CURETTAGE AND POLYP:  - Scant fragments of benign endometrioid polyp  - Benign secretory endometrium  - Negative for hyperplasia or malignancy   GYN HISTORY: No sig hx  OB History  Gravida Para Term Preterm AB Living  5 2 2  3 2   SAB IAB Ectopic Multiple Live Births  3    2    # Outcome Date GA Lbr Len/2nd Weight Sex Type Anes PTL Lv  5 SAB           4 SAB           3 SAB           2 Term      Vag-Spont   LIV  1 Term      Vag-Spont   LIV    Past Medical History:  Diagnosis Date   Breast discharge    bil for 2 years white spontaneous   Cough 04/11/2012   Hemorrhoids, external 03/2012   symptomatic   Hyperlipidemia    no current med.   Indigestion    takes OTC as needed    Past Surgical History:  Procedure Laterality Date   BAND HEMORRHOIDECTOMY  01/2012   BREAST EXCISIONAL BIOPSY Left 2018   BREAST LUMPECTOMY WITH RADIOACTIVE SEED LOCALIZATION Left 02/07/2017   PATH: ductal papilloma; no malignancy.  Procedure: LEFT BREAST LUMPECTOMY WITH RADIOACTIVE SEED LOCALIZATION;  Surgeon: Vernetta Berg, MD;  Location: [facility];  Service: General;  Laterality: Left;   DILATATION & CURETTAGE/HYSTEROSCOPY WITH MYOSURE N/A 04/11/2017   PATH= BENIGN. Procedure: DILATATION  & CURETTAGE/HYSTEROSCOPY WITH MYOSURE POLYPECTOMY;  Surgeon: Rosalva Sawyer, MD;  Location: [facility];  Service: Gynecology;  Laterality: N/A;  Possible Polypectomy   DILATATION & CURRETTAGE/HYSTEROSCOPY WITH RESECTOCOPE N/A 03/25/2024   Procedure: DILATATION & CURETTAGE/DIAGNOSTIC HYSTEROSCOPY WITH POLYPECTOMY;  Surgeon: Dallie Vera GAILS, MD;  Location: [facility];  Service: Gynecology;  Laterality: N/A;  MYOSURE   HEMORRHOID SURGERY  04/17/2012   Procedure: HEMORRHOIDECTOMY;  Surgeon: Berg DELENA Vernetta, MD;  Location: [facility];  Service: General;  Laterality: N/A;  2 column external /internal hemorrhoidectomy   MYOSURE RESECTION N/A 03/25/2024   Procedure: MELINDA RESECTION;  Surgeon: Dallie Vera GAILS, MD;  Location: [facility];  Service: Gynecology;  Laterality: N/A;    No current outpatient medications on file prior to visit.   No current facility-administered medications on file prior to visit.    No Known Allergies    PE Today's Vitals   04/07/24 0902  BP: 104/62  Pulse: 80  Temp: 97.7 F (36.5 C)  TempSrc: Oral  SpO2: 99%  Weight: 131 lb (59.4 kg)   Body mass index is 22.84 kg/m.  Physical Exam Vitals reviewed.  Constitutional:      General: She is not in acute distress.    Appearance: Normal appearance.  HENT:     Head: Normocephalic  and atraumatic.     Nose: Nose normal.  Eyes:     Extraocular Movements: Extraocular movements intact.     Conjunctiva/sclera: Conjunctivae normal.  Pulmonary:     Effort: Pulmonary effort is normal.  Abdominal:     General: There is no distension.     Palpations: Abdomen is soft.     Tenderness: There is no abdominal tenderness.  Musculoskeletal:        General: Normal range of motion.     Cervical back: Normal range of motion.  Neurological:     General: No focal deficit present.     Mental Status: She is alert.  Psychiatric:        Mood and Affect: Mood normal.        Behavior: Behavior normal.      Assessment and  Plan:        Postop check  Endometrial mass  Normal postop exam.  Pathology reviewed with patient. Cleared to return to work. All questions answered.   Vera LULLA Pa, MD
